# Patient Record
Sex: Female | Born: 1935
Health system: Southern US, Community
[De-identification: ages and names within clinical notes are randomized; demographics above are authoritative.]

## PROBLEM LIST (undated history)

## (undated) DIAGNOSIS — R3129 Other microscopic hematuria: Secondary | ICD-10-CM

## (undated) DIAGNOSIS — Z01419 Encounter for gynecological examination (general) (routine) without abnormal findings: Secondary | ICD-10-CM

## (undated) DIAGNOSIS — E042 Nontoxic multinodular goiter: Secondary | ICD-10-CM

## (undated) DIAGNOSIS — G4700 Insomnia, unspecified: Secondary | ICD-10-CM

## (undated) DIAGNOSIS — E785 Hyperlipidemia, unspecified: Secondary | ICD-10-CM

## (undated) DIAGNOSIS — M8580 Other specified disorders of bone density and structure, unspecified site: Secondary | ICD-10-CM

## (undated) DIAGNOSIS — T7840XA Allergy, unspecified, initial encounter: Secondary | ICD-10-CM

## (undated) HISTORY — DX: Other specified disorders of bone density and structure, unspecified site: M85.80

## (undated) HISTORY — DX: Nontoxic multinodular goiter: E04.2

## (undated) HISTORY — DX: Insomnia, unspecified: G47.00

## (undated) HISTORY — DX: Allergy, unspecified, initial encounter: T78.40XA

## (undated) HISTORY — PX: CATARACT EXTRACTION: SUR2

## (undated) HISTORY — DX: Hyperlipidemia, unspecified: E78.5

## (undated) HISTORY — DX: Encounter for gynecological examination (general) (routine) without abnormal findings: Z01.419

## (undated) HISTORY — PX: RETINAL DETACHMENT SURGERY: SHX105

## (undated) HISTORY — DX: Other microscopic hematuria: R31.29

---

## 2001-12-21 ENCOUNTER — Emergency Department (HOSPITAL_COMMUNITY): Admission: EM | Admit: 2001-12-21 | Discharge: 2001-12-21 | Payer: Self-pay | Admitting: Emergency Medicine

## 2001-12-21 ENCOUNTER — Encounter: Payer: Self-pay | Admitting: Emergency Medicine

## 2002-03-15 ENCOUNTER — Other Ambulatory Visit: Admission: RE | Admit: 2002-03-15 | Discharge: 2002-03-15 | Payer: Self-pay | Admitting: Obstetrics and Gynecology

## 2002-04-11 DIAGNOSIS — E042 Nontoxic multinodular goiter: Secondary | ICD-10-CM

## 2002-04-11 HISTORY — DX: Nontoxic multinodular goiter: E04.2

## 2002-09-23 ENCOUNTER — Encounter (INDEPENDENT_AMBULATORY_CARE_PROVIDER_SITE_OTHER): Payer: Self-pay | Admitting: *Deleted

## 2002-09-23 ENCOUNTER — Ambulatory Visit (HOSPITAL_COMMUNITY): Admission: RE | Admit: 2002-09-23 | Discharge: 2002-09-23 | Payer: Self-pay | Admitting: *Deleted

## 2003-04-03 ENCOUNTER — Other Ambulatory Visit: Admission: RE | Admit: 2003-04-03 | Discharge: 2003-04-03 | Payer: Self-pay | Admitting: Diagnostic Radiology

## 2003-08-28 ENCOUNTER — Other Ambulatory Visit: Admission: RE | Admit: 2003-08-28 | Discharge: 2003-08-28 | Payer: Self-pay | Admitting: Obstetrics and Gynecology

## 2004-02-20 ENCOUNTER — Ambulatory Visit (HOSPITAL_BASED_OUTPATIENT_CLINIC_OR_DEPARTMENT_OTHER): Admission: RE | Admit: 2004-02-20 | Discharge: 2004-02-20 | Payer: Self-pay | Admitting: General Surgery

## 2004-04-11 HISTORY — PX: HERNIA REPAIR: SHX51

## 2004-09-21 ENCOUNTER — Other Ambulatory Visit: Admission: RE | Admit: 2004-09-21 | Discharge: 2004-09-21 | Payer: Self-pay | Admitting: Obstetrics and Gynecology

## 2004-09-29 ENCOUNTER — Ambulatory Visit (HOSPITAL_COMMUNITY): Admission: RE | Admit: 2004-09-29 | Discharge: 2004-09-29 | Payer: Self-pay | Admitting: Obstetrics and Gynecology

## 2006-08-27 ENCOUNTER — Emergency Department (HOSPITAL_COMMUNITY): Admission: EM | Admit: 2006-08-27 | Discharge: 2006-08-27 | Payer: Self-pay | Admitting: Emergency Medicine

## 2007-03-14 ENCOUNTER — Ambulatory Visit: Payer: Self-pay | Admitting: Family Medicine

## 2007-03-14 DIAGNOSIS — J309 Allergic rhinitis, unspecified: Secondary | ICD-10-CM | POA: Insufficient documentation

## 2007-03-14 DIAGNOSIS — E782 Mixed hyperlipidemia: Secondary | ICD-10-CM

## 2007-03-15 LAB — CONVERTED CEMR LAB
Alkaline Phosphatase: 81 units/L (ref 39–117)
BUN: 10 mg/dL (ref 6–23)
Basophils Absolute: 0 10*3/uL (ref 0.0–0.1)
CO2: 28 meq/L (ref 19–32)
Cholesterol: 156 mg/dL (ref 0–200)
Eosinophils Absolute: 0.1 10*3/uL (ref 0.0–0.6)
GFR calc Af Amer: 80 mL/min
HDL: 61.7 mg/dL (ref >39.0)
Hemoglobin: 13.5 g/dL (ref 12.0–15.0)
Lymphocytes Relative: 12.3 % (ref 12.0–46.0)
MCHC: 34.7 g/dL (ref 30.0–36.0)
MCV: 94.9 fL (ref 78.0–100.0)
Monocytes Absolute: 0.9 10*3/uL — ABNORMAL HIGH (ref 0.2–0.7)
Monocytes Relative: 9.1 % (ref 3.0–11.0)
Neutro Abs: 7.8 10*3/uL — ABNORMAL HIGH (ref 1.4–7.7)
Potassium: 4.5 meq/L (ref 3.5–5.1)
Total Protein: 7.1 g/dL (ref 6.0–8.3)

## 2007-06-19 ENCOUNTER — Ambulatory Visit: Payer: Self-pay | Admitting: Family Medicine

## 2007-06-19 DIAGNOSIS — F411 Generalized anxiety disorder: Secondary | ICD-10-CM | POA: Insufficient documentation

## 2007-06-19 DIAGNOSIS — R002 Palpitations: Secondary | ICD-10-CM | POA: Insufficient documentation

## 2007-07-30 ENCOUNTER — Ambulatory Visit: Payer: Self-pay | Admitting: Family Medicine

## 2007-07-30 DIAGNOSIS — R209 Unspecified disturbances of skin sensation: Secondary | ICD-10-CM

## 2007-08-02 LAB — CONVERTED CEMR LAB
ALT: 40 units/L — ABNORMAL HIGH (ref 0–35)
AST: 31 units/L (ref 0–37)
Albumin: 3.9 g/dL (ref 3.5–5.2)
BUN: 17 mg/dL (ref 6–23)
Basophils Relative: 0.1 % (ref 0.0–1.0)
CO2: 32 meq/L (ref 19–32)
Chloride: 106 meq/L (ref 96–112)
Creatinine, Ser: 0.9 mg/dL (ref 0.4–1.2)
Eosinophils Absolute: 0.1 10*3/uL (ref 0.0–0.7)
Eosinophils Relative: 1 % (ref 0.0–5.0)
Glucose, Bld: 84 mg/dL (ref 70–99)
Lymphocytes Relative: 20.7 % (ref 12.0–46.0)
MCV: 95.5 fL (ref 78.0–100.0)
Neutrophils Relative %: 69.2 % (ref 43.0–77.0)
RBC: 3.97 M/uL (ref 3.87–5.11)
Total Protein: 7 g/dL (ref 6.0–8.3)
Vitamin B-12: 721 pg/mL (ref 211–911)
WBC: 9.5 10*3/uL (ref 4.5–10.5)

## 2007-09-23 ENCOUNTER — Emergency Department (HOSPITAL_BASED_OUTPATIENT_CLINIC_OR_DEPARTMENT_OTHER): Admission: EM | Admit: 2007-09-23 | Discharge: 2007-09-23 | Payer: Self-pay | Admitting: Emergency Medicine

## 2007-10-17 ENCOUNTER — Encounter: Payer: Self-pay | Admitting: Family Medicine

## 2008-01-28 ENCOUNTER — Ambulatory Visit: Payer: Self-pay | Admitting: Family Medicine

## 2008-04-21 ENCOUNTER — Ambulatory Visit: Payer: Self-pay | Admitting: Family Medicine

## 2008-04-25 ENCOUNTER — Ambulatory Visit: Payer: Self-pay | Admitting: Family Medicine

## 2008-05-02 ENCOUNTER — Ambulatory Visit: Payer: Self-pay | Admitting: Family Medicine

## 2008-05-05 ENCOUNTER — Ambulatory Visit: Payer: Self-pay | Admitting: Family Medicine

## 2008-05-06 ENCOUNTER — Telehealth: Payer: Self-pay | Admitting: Family Medicine

## 2008-05-12 ENCOUNTER — Telehealth: Payer: Self-pay | Admitting: Family Medicine

## 2008-07-10 HISTORY — PX: COLONOSCOPY: SHX174

## 2008-08-19 ENCOUNTER — Ambulatory Visit: Payer: Self-pay | Admitting: Family Medicine

## 2008-08-21 LAB — CONVERTED CEMR LAB
ALT: 23 units/L (ref 0–35)
Albumin: 4 g/dL (ref 3.5–5.2)
Alkaline Phosphatase: 58 units/L (ref 39–117)
Basophils Relative: 0 % (ref 0.0–3.0)
CO2: 30 meq/L (ref 19–32)
Chloride: 107 meq/L (ref 96–112)
Eosinophils Absolute: 0.1 10*3/uL (ref 0.0–0.7)
Eosinophils Relative: 1.2 % (ref 0.0–5.0)
Hemoglobin: 13.5 g/dL (ref 12.0–15.0)
Lymphocytes Relative: 15.8 % (ref 12.0–46.0)
MCHC: 35 g/dL (ref 30.0–36.0)
MCV: 96.3 fL (ref 78.0–100.0)
Monocytes Absolute: 0.9 10*3/uL (ref 0.1–1.0)
Neutro Abs: 8.3 10*3/uL — ABNORMAL HIGH (ref 1.4–7.7)
RBC: 4 M/uL (ref 3.87–5.11)
Sodium: 142 meq/L (ref 135–145)
Total CHOL/HDL Ratio: 3
Total Protein: 7.3 g/dL (ref 6.0–8.3)
WBC: 11 10*3/uL — ABNORMAL HIGH (ref 4.5–10.5)

## 2008-08-29 ENCOUNTER — Ambulatory Visit: Payer: Self-pay | Admitting: Family Medicine

## 2008-08-29 LAB — CONVERTED CEMR LAB
Glucose, Urine, Semiquant: NEGATIVE
Nitrite: NEGATIVE
Urobilinogen, UA: 0.2
WBC Urine, dipstick: NEGATIVE
pH: 5.5

## 2008-08-30 ENCOUNTER — Encounter: Payer: Self-pay | Admitting: Family Medicine

## 2009-01-27 ENCOUNTER — Ambulatory Visit: Payer: Self-pay | Admitting: Family Medicine

## 2009-01-27 DIAGNOSIS — E559 Vitamin D deficiency, unspecified: Secondary | ICD-10-CM | POA: Insufficient documentation

## 2009-06-22 ENCOUNTER — Ambulatory Visit: Payer: Self-pay | Admitting: Diagnostic Radiology

## 2009-06-22 ENCOUNTER — Emergency Department (HOSPITAL_BASED_OUTPATIENT_CLINIC_OR_DEPARTMENT_OTHER): Admission: EM | Admit: 2009-06-22 | Discharge: 2009-06-22 | Payer: Self-pay | Admitting: Emergency Medicine

## 2009-06-26 ENCOUNTER — Ambulatory Visit: Payer: Self-pay | Admitting: Family Medicine

## 2009-06-26 DIAGNOSIS — M546 Pain in thoracic spine: Secondary | ICD-10-CM | POA: Insufficient documentation

## 2009-06-26 DIAGNOSIS — M899 Disorder of bone, unspecified: Secondary | ICD-10-CM | POA: Insufficient documentation

## 2009-06-26 DIAGNOSIS — M949 Disorder of cartilage, unspecified: Secondary | ICD-10-CM

## 2009-08-24 ENCOUNTER — Ambulatory Visit: Payer: Self-pay | Admitting: Family Medicine

## 2009-08-24 DIAGNOSIS — I6529 Occlusion and stenosis of unspecified carotid artery: Secondary | ICD-10-CM | POA: Insufficient documentation

## 2009-08-24 LAB — CONVERTED CEMR LAB
Bilirubin Urine: NEGATIVE
Ketones, urine, test strip: NEGATIVE
Nitrite: NEGATIVE
Protein, U semiquant: NEGATIVE
Urobilinogen, UA: 0.2

## 2009-08-26 LAB — CONVERTED CEMR LAB: Vit D, 25-Hydroxy: 41 ng/mL (ref 30–89)

## 2009-08-27 LAB — CONVERTED CEMR LAB
ALT: 23 units/L (ref 0–35)
Alkaline Phosphatase: 66 units/L (ref 39–117)
BUN: 16 mg/dL (ref 6–23)
Basophils Relative: 0.3 % (ref 0.0–3.0)
Bilirubin, Direct: 0.1 mg/dL (ref 0.0–0.3)
Calcium: 9 mg/dL (ref 8.4–10.5)
Cholesterol: 179 mg/dL (ref 0–200)
Creatinine, Ser: 0.7 mg/dL (ref 0.4–1.2)
Eosinophils Absolute: 0.2 10*3/uL (ref 0.0–0.7)
Eosinophils Relative: 1.9 % (ref 0.0–5.0)
HDL: 65.4 mg/dL (ref >39.00)
LDL Cholesterol: 93 mg/dL (ref 0–99)
Lymphocytes Relative: 17.8 % (ref 12.0–46.0)
MCV: 95.4 fL (ref 78.0–100.0)
Monocytes Absolute: 0.8 10*3/uL (ref 0.1–1.0)
Neutrophils Relative %: 71.4 % (ref 43.0–77.0)
Platelets: 350 10*3/uL (ref 150.0–400.0)
RBC: 3.86 M/uL — ABNORMAL LOW (ref 3.87–5.11)
Total Bilirubin: 0.4 mg/dL (ref 0.3–1.2)
Total CHOL/HDL Ratio: 3
Total Protein: 6.6 g/dL (ref 6.0–8.3)
Triglycerides: 104 mg/dL (ref 0.0–149.0)
VLDL: 20.8 mg/dL (ref 0.0–40.0)
WBC: 9.2 10*3/uL (ref 4.5–10.5)

## 2009-09-22 ENCOUNTER — Encounter: Payer: Self-pay | Admitting: Family Medicine

## 2009-09-23 ENCOUNTER — Ambulatory Visit: Payer: Self-pay

## 2009-09-23 ENCOUNTER — Encounter: Payer: Self-pay | Admitting: Family Medicine

## 2009-10-01 ENCOUNTER — Telehealth: Payer: Self-pay | Admitting: Family Medicine

## 2009-10-02 ENCOUNTER — Telehealth: Payer: Self-pay | Admitting: Family Medicine

## 2009-12-21 ENCOUNTER — Telehealth: Payer: Self-pay | Admitting: Family Medicine

## 2010-01-13 ENCOUNTER — Telehealth: Payer: Self-pay | Admitting: Family Medicine

## 2010-03-02 ENCOUNTER — Ambulatory Visit: Payer: Self-pay | Admitting: Family Medicine

## 2010-03-02 DIAGNOSIS — H9190 Unspecified hearing loss, unspecified ear: Secondary | ICD-10-CM | POA: Insufficient documentation

## 2010-03-12 ENCOUNTER — Encounter: Payer: Self-pay | Admitting: Family Medicine

## 2010-03-18 ENCOUNTER — Ambulatory Visit (HOSPITAL_COMMUNITY)
Admission: RE | Admit: 2010-03-18 | Discharge: 2010-03-18 | Payer: Self-pay | Source: Home / Self Care | Attending: Obstetrics and Gynecology | Admitting: Obstetrics and Gynecology

## 2010-05-01 ENCOUNTER — Encounter: Payer: Self-pay | Admitting: Family Medicine

## 2010-05-02 ENCOUNTER — Encounter: Payer: Self-pay | Admitting: Family Medicine

## 2010-05-11 NOTE — Progress Notes (Signed)
Summary: judi please call  Phone Note Call from Patient Call back at Home Phone [phone]   Caller: Patient Call For: Nelwyn Salisbury MD Summary of Call: pt would like judi call her today Initial call taken by: Heron Sabins,  October 02, 2009 12:56 PM  Follow-up for Phone Call        Phone Call Completed Follow-up by: Raechel Ache, RN,  October 02, 2009 1:01 PM

## 2010-05-11 NOTE — Progress Notes (Signed)
Summary: please call cell  Phone Note Call from Patient Call back at [phone] cell   Caller: Patient Call For: Nelwyn Salisbury MD Summary of Call: judi please call pt on cell Initial call taken by: Heron Sabins,  October 01, 2009 2:22 PM  Follow-up for Phone Call        report given Follow-up by: Raechel Ache, RN,  October 02, 2009 8:22 AM

## 2010-05-11 NOTE — Progress Notes (Signed)
Summary: refill zolpidem  Phone Note From Pharmacy   [facility] Summary of Call: refill zolpidem  was given rx may 11 with 5 refills Initial call taken by: Pura Spice, RN,  December 21, 2009 12:38 PM  Follow-up for Phone Call        No, I gave her a 6 month supply in May Follow-up by: Nelwyn Salisbury MD,  December 22, 2009 8:26 AM

## 2010-05-11 NOTE — Consult Note (Signed)
Summary: [facility]  [facility]   Imported By: Maryln Gottron 03/17/2010 15:13:59  _____________________________________________________________________  External Attachment:    Type:   Image     Comment:   External Document

## 2010-05-11 NOTE — Miscellaneous (Signed)
Summary: Orders Update  Clinical Lists Changes  Orders: Added new Test order of Carotid Duplex (Carotid Duplex) - Signed 

## 2010-05-11 NOTE — Assessment & Plan Note (Signed)
Summary: EAR DISCOMFORT / CONGESTION // RS   Vital Signs:  Patient profile:   76 year old female Weight:      139 pounds O2 Sat:      98 % Temp:     97.8 degrees F Pulse rate:   86 / minute BP sitting:   110 / 78  (left arm) Cuff size:   regular  Vitals Entered By: Pura Spice, RN (March 02, 2010 2:31 PM) CC: loss hearing left ear wants referral to ENT    History of Present Illness: Here for loss of hearing in the left ear over the past few days. She has some underlying hearing loss and tinnitis, but this loss is beyond the typical. No URI symptoms, no pain, no dizziness. She is asking for a referral to ENT. She is taking Zyrtec and Mucinex D.  Allergies (verified): No Known Drug Allergies  Past History:  Past Medical History: Reviewed history from 08/19/2008 and no changes required. Allergic rhinitis Hyperlipidemia benign thyroid nodules per [place] and uptake scan 11-04 (saw Dr. Gerrit Friends), and negative biopsy 12-04 negative cardiac workup for chest pains in 1-08, saw Dr. Elsie Lincoln, had ECHO and treadmill stress test osteopenia per DEXA 08-2008 insomnia benign hematuria, worked up by  Dr. Vernie Ammons 10-2007 sees Dr. Arelia Sneddon for GYN exams  Past Surgical History: Reviewed history from 08/19/2008 and no changes required. Cataract extraction, bilateral (sees Dr. Jettie Pagan at [facility])  Umbilical Hernia repair 2006 bilateral retinal tears, S/P laser treatments colonoscopy 07-2008 per Dr. Charolett Bumpers, repeat in 5 yrs  Review of Systems  The patient denies anorexia, fever, weight loss, weight gain, vision loss, hoarseness, chest pain, syncope, dyspnea on exertion, peripheral edema, prolonged cough, headaches, hemoptysis, abdominal pain, melena, hematochezia, severe indigestion/heartburn, hematuria, incontinence, genital sores, muscle weakness, suspicious skin lesions, transient blindness, difficulty walking, depression, unusual weight change, abnormal bleeding, enlarged lymph nodes,  angioedema, breast masses, and testicular masses.    Physical Exam  General:  Well-developed,well-nourished,in no acute distress; alert,appropriate and cooperative throughout examination Head:  Normocephalic and atraumatic without obvious abnormalities. No apparent alopecia or balding. Eyes:  No corneal or conjunctival inflammation noted. EOMI. Perrla. Funduscopic exam benign, without hemorrhages, exudates or papilledema. Vision grossly normal. Ears:  External ear exam shows no significant lesions or deformities.  Otoscopic examination reveals clear canals, tympanic membranes are intact bilaterally without bulging, retraction, inflammation or discharge. Hearing is grossly normal bilaterally. Nose:  External nasal examination shows no deformity or inflammation. Nasal mucosa are pink and moist without lesions or exudates. Mouth:  Oral mucosa and oropharynx without lesions or exudates.  Teeth in good repair. Neck:  No deformities, masses, or tenderness noted. Lungs:  Normal respiratory effort, chest expands symmetrically. Lungs are clear to auscultation, no crackles or wheezes.   Impression & Recommendations:  Problem # 1:  HEARING LOSS (ICD-389.9)  Orders: ENT Referral (ENT)  Problem # 2:  EUSTACHIAN TUBE DYSFUNCTION (ICD-381.81)  Complete Medication List: 1)  Lipitor 20 Mg Tabs (Atorvastatin calcium) .Marland Kitchen.. 1 by mouth once daily 2)  Ambien 5 Mg Tabs (Zolpidem tartrate) .Marland Kitchen.. 1 or 2 at bedtime as needed 3)  Metoprolol Succinate 25 Mg Xr24h-tab (Metoprolol succinate) .... Once daily 4)  Aspir-low 81 Mg Tbec (Aspirin) .... Once daily 5)  Zyrtec Allergy 10 Mg Tbdp (Cetirizine hcl) .... Once daily as needed allergies 6)  Prednisone (pak) 10 Mg Tabs (Prednisone) .... As directed for 6 days  Patient Instructions: 1)  Try a prednisone dose pack. Refer to ENT Prescriptions:  PREDNISONE (PAK) 10 MG TABS (PREDNISONE) as directed for 6 days  #1 x 0   Entered and Authorized by:   Nelwyn Salisbury MD    Signed by:   Nelwyn Salisbury MD on 03/02/2010   Method used:   Electronically to        [facility] # [phone]* (retail)       [address]       [place], [place]  [address]       Ph: [phone]       Fax: [phone]   RxID:   [id number]    Orders Added: 1)  Est. Patient Level IV [24401] 2)  ENT Referral [ENT]

## 2010-05-11 NOTE — Assessment & Plan Note (Signed)
Summary: CPX (PT WILL COME IN FASTING) // RS   Vital Signs:  Patient profile:   76 year old female Weight:      126 pounds BMI:     23.89 BP sitting:   110 / 64  (left arm) Cuff size:   regular  Vitals Entered By: Raechel Ache, RN (Aug 24, 2009 9:11 AM) CC: OV, fasting. Seeing GYN today. , Hypertension Management   History of Present Illness: 76 yr old female for a cpx. She feels well physically but she is [place] lot of stress dealing with family issues. She has trouble sleeping, but Zolpidem helps. She would like to have some more Ativan to keep around. She asks about a carotid US, since she had a workplace screening version of this done about 5 years ago and was told she had a "slight blockage". She has no symptoms of this.   Hypertension History:      Positive major cardiovascular risk factors include female age 57 years old or older and hyperlipidemia.  Negative major cardiovascular risk factors include non-tobacco-user status.     Allergies (verified): No Known Drug Allergies  Past History:  Past Medical History: Reviewed history from 08/19/2008 and no changes required. Allergic rhinitis Hyperlipidemia benign thyroid nodules per [place] and uptake scan 11-04 (saw Dr. Gerrit Friends), and negative biopsy 12-04 negative cardiac workup for chest pains in 1-08, saw Dr. Elsie Lincoln, had ECHO and treadmill stress test osteopenia per DEXA 08-2008 insomnia benign hematuria, worked up by  Dr. Vernie Ammons 10-2007 sees Dr. Arelia Sneddon for GYN exams  Past Surgical History: Reviewed history from 08/19/2008 and no changes required. Cataract extraction, bilateral (sees Dr. Jettie Pagan at [facility])  Umbilical Hernia repair 2006 bilateral retinal tears, S/P laser treatments colonoscopy 07-2008 per Dr. Charolett Bumpers, repeat in 5 yrs  Family History: Reviewed history from 03/14/2007 and no changes required. Family History Diabetes 1st degree relative Family History of Prostate CA 1st degree relative <50 Family  History of Heart disease  Social History: Reviewed history from 03/14/2007 and no changes required. Widow Never Smoked Alcohol use-no Drug use-no Regular exercise-yes OccupationPublishing rights manager of nursing at [employer]  Review of Systems  The patient denies anorexia, fever, weight loss, weight gain, vision loss, decreased hearing, hoarseness, chest pain, syncope, dyspnea on exertion, peripheral edema, prolonged cough, headaches, hemoptysis, abdominal pain, melena, hematochezia, severe indigestion/heartburn, hematuria, incontinence, genital sores, muscle weakness, suspicious skin lesions, transient blindness, difficulty walking, depression, unusual weight change, abnormal bleeding, enlarged lymph nodes, angioedema, breast masses, and testicular masses.    Physical Exam  General:  Well-developed,well-nourished,in no acute distress; alert,appropriate and cooperative throughout examination Head:  Normocephalic and atraumatic without obvious abnormalities. No apparent alopecia or balding. Eyes:  No corneal or conjunctival inflammation noted. EOMI. Perrla. Funduscopic exam benign, without hemorrhages, exudates or papilledema. Vision grossly normal. Ears:  External ear exam shows no significant lesions or deformities.  Otoscopic examination reveals clear canals, tympanic membranes are intact bilaterally without bulging, retraction, inflammation or discharge. Hearing is grossly normal bilaterally. Nose:  External nasal examination shows no deformity or inflammation. Nasal mucosa are pink and moist without lesions or exudates. Mouth:  Oral mucosa and oropharynx without lesions or exudates.  Teeth in good repair. Neck:  No deformities, masses, or tenderness noted. No bruits. Chest Wall:  No deformities, masses, or tenderness noted. Lungs:  Normal respiratory effort, chest expands symmetrically. Lungs are clear to auscultation, no crackles or wheezes. Heart:  Normal rate and regular rhythm. S1 and S2  normal without  gallop, murmur, click, rub or other extra sounds. EKG normal Abdomen:  Bowel sounds positive,abdomen soft and non-tender without masses, organomegaly or hernias noted. Msk:  No deformity or scoliosis noted of thoracic or lumbar spine.   Pulses:  R and L carotid,radial,femoral,dorsalis pedis and posterior tibial pulses are full and equal bilaterally Extremities:  No clubbing, cyanosis, edema, or deformity noted with normal full range of motion of all joints.   Neurologic:  No cranial nerve deficits noted. Station and gait are normal. Plantar reflexes are down-going bilaterally. DTRs are symmetrical throughout. Sensory, motor and coordinative functions appear intact. Skin:  Intact without suspicious lesions or rashes Cervical Nodes:  No lymphadenopathy noted Axillary Nodes:  No palpable lymphadenopathy Inguinal Nodes:  No significant adenopathy Psych:  Cognition and judgment appear intact. Alert and cooperative with normal attention span and concentration. No apparent delusions, illusions, hallucinations   Impression & Recommendations:  Problem # 1:  WELL ADULT EXAM (ICD-V70.0)  Orders: UA Dipstick w/o Micro (automated)  (81003) EKG w/ Interpretation (93000) Venipuncture (54098) TLB-Lipid Panel (80061-LIPID) TLB-BMP (Basic Metabolic Panel-BMET) (80048-METABOL) TLB-CBC Platelet - w/Differential (85025-CBCD) TLB-Hepatic/Liver Function Pnl (80076-HEPATIC) TLB-TSH (Thyroid Stimulating Hormone) (84443-TSH) T-Vitamin D (25-Hydroxy) (11914-78295)  Complete Medication List: 1)  Lipitor 20 Mg Tabs (Atorvastatin calcium) .Marland Kitchen.. 1 by mouth once daily 2)  Ambien 5 Mg Tabs (Zolpidem tartrate) .Marland Kitchen.. 1 or 2 at bedtime as needed 3)  Metoprolol Succinate 25 Mg Xr24h-tab (Metoprolol succinate) .... Once daily 4)  Aspir-low 81 Mg Tbec (Aspirin) .... Once daily 5)  Zyrtec Allergy 10 Mg Tbdp (Cetirizine hcl) .... Once daily as needed allergies 6)  Ativan 0.5 Mg Tabs (Lorazepam) .Marland Kitchen.. 1 q 6  hours as needed anxiety  Other Orders: Doppler Referral (Doppler)  Hypertension Assessment/Plan:      The patient's hypertensive risk group is category B: At least one risk factor (excluding diabetes) with no target organ damage.  Her calculated 10 year risk of coronary heart disease is 3 %.  Today's blood pressure is 110/64.    Patient Instructions: 1)  get labs. Use Ativan as needed . Set up repeat carotid US. Start on daily low dose aspirin.  Prescriptions: ATIVAN 0.5 MG TABS (LORAZEPAM) 1 q 6 hours as needed anxiety  #60 x 5   Entered and Authorized by:   Nelwyn Salisbury MD   Signed by:   Nelwyn Salisbury MD on 08/24/2009   Method used:   Print then Give to Patient   RxID:   [id number] AMBIEN 5 MG  TABS (ZOLPIDEM TARTRATE) 1 or 2 at bedtime as needed  #60 x 5   Entered and Authorized by:   Nelwyn Salisbury MD   Signed by:   Nelwyn Salisbury MD on 08/24/2009   Method used:   Print then Give to Patient   RxID:   [id number] LIPITOR 20 MG TABS (ATORVASTATIN CALCIUM) 1 by mouth once daily  #90 x 3   Entered and Authorized by:   Nelwyn Salisbury MD   Signed by:   Nelwyn Salisbury MD on 08/24/2009   Method used:   Print then Give to Patient   RxID:   [id number]   Laboratory Results   Urine Tests    Routine Urinalysis   Color: yellow Appearance: Clear Glucose: negative   (Normal Range: Negative) Bilirubin: negative   (Normal Range: Negative) Ketone: negative   (Normal Range: Negative) Spec. Gravity: 1.015   (Normal Range: 1.003-1.035) Blood: 1+   (Normal Range: Negative) pH: 7.0   (  Normal Range: 5.0-8.0) Protein: negative   (Normal Range: Negative) Urobilinogen: 0.2   (Normal Range: 0-1) Nitrite: negative   (Normal Range: Negative) Leukocyte Esterace: negative   (Normal Range: Negative)    Comments: Rita Ohara  Aug 24, 2009 11:16 AM

## 2010-05-11 NOTE — Progress Notes (Signed)
Summary: Beverley Fiedler   Phone Note From Pharmacy   [facility]  Reason for Call: Needs renewal Summary of Call: refill ambien 5mg   last ov was cpx on 08/24/09 last refill 5/16/1 for 60 tabs w 5 rfs....Marland KitchenMarland KitchenKathrynn Speed.....Marland KitchenCMA.................. Initial call taken by: Pura Spice, RN,  January 13, 2010 5:25 PM  Follow-up for Phone Call        call in #60 with 5 rf Follow-up by: Nelwyn Salisbury MD,  January 15, 2010 8:34 AM  Additional Follow-up for Phone Call Additional follow up Details #1::        done Additional Follow-up by: Pura Spice, RN,  January 15, 2010 8:51 AM    Prescriptions: AMBIEN 5 MG  TABS (ZOLPIDEM TARTRATE) 1 or 2 at bedtime as needed  #30 x 5   Entered by:   Pura Spice, RN   Authorized by:   Nelwyn Salisbury MD   Signed by:   Pura Spice, RN on 01/15/2010   Method used:   Telephoned to ...       [facility] # [id number]* (retail)       [address]       [place], [place]  [address]       Ph: [phone]       Fax: [phone]   RxID:   [id number]
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
## 2010-06-18 ENCOUNTER — Other Ambulatory Visit: Payer: Self-pay | Admitting: *Deleted

## 2010-06-18 MED ORDER — ZOLPIDEM TARTRATE 5 MG PO TABS: 5.0000 mg | ORAL_TABLET | Freq: Every evening | ORAL | Status: DC | PRN

## 2010-06-18 NOTE — Telephone Encounter (Signed)
Refill request

## 2010-06-22 LAB — CBC
HCT: 39.4 % (ref 36.0–46.0)
Hemoglobin: 13.1 g/dL (ref 12.0–15.0)
MCV: 98.7 fL (ref 78.0–100.0)
WBC: 16.5 10*3/uL — ABNORMAL HIGH (ref 4.0–10.5)

## 2010-07-12 ENCOUNTER — Ambulatory Visit (INDEPENDENT_AMBULATORY_CARE_PROVIDER_SITE_OTHER): Payer: BC Managed Care – PPO | Admitting: Family Medicine

## 2010-07-12 ENCOUNTER — Encounter: Payer: Self-pay | Admitting: Family Medicine

## 2010-07-12 DIAGNOSIS — H9190 Unspecified hearing loss, unspecified ear: Secondary | ICD-10-CM

## 2010-07-12 DIAGNOSIS — H9319 Tinnitus, unspecified ear: Secondary | ICD-10-CM

## 2010-07-12 MED ORDER — PREDNISONE 10 MG PO TABS: ORAL_TABLET | ORAL | Status: DC

## 2010-07-12 MED ORDER — PAROXETINE HCL 20 MG PO TABS: 20.0000 mg | ORAL_TABLET | ORAL | Status: DC

## 2010-07-12 NOTE — Progress Notes (Signed)
  Subjective:    Patient ID: Alexandra Baldwin, female    DOB: 1935-03-04, 76 y.o.   MRN: [id number]  HPI Here to ask about hearing losses. She saw me last December for the fairly sudden loss of hearing in the left ear. No pain or trauma was related. I put her on a steroid taper and referred her to Dr. Jearld Fenton at ENT. He did no further testing but did recommend another steroid taper. The hearing did not improve, and over the past few months she has devel;oped some ringing in this ear as well. Now over the past few weeks she has begun to lose soome hearing in the right ear as well. She has larerady made an appt to see another ENT in [place] on 07-22-10, but she asks my opinion. No other neurologic symptoms. No HA or dizziness.   Review of Systems  Constitutional: Negative.   HENT: Positive for hearing loss and tinnitus.   Eyes: Negative.   Respiratory: Negative.   Cardiovascular: Negative.   Neurological: Negative.        Objective:   Physical Exam  Constitutional: She is oriented to person, place, and time. She appears well-developed and well-nourished.  HENT:  Head: Normocephalic and atraumatic.  Right Ear: External ear normal.  Left Ear: External ear normal.  Nose: Nose normal.  Mouth/Throat: Oropharynx is clear and moist. No oropharyngeal exudate.  Eyes: Conjunctivae and EOM are normal. Pupils are equal, round, and reactive to light.  Neck: Normal range of motion. Neck supple. No thyromegaly present.  Lymphadenopathy:    She has no cervical adenopathy.  Neurological: She is alert and oriented to person, place, and time. She displays normal reflexes. No cranial nerve deficit. She exhibits normal muscle tone. Coordination normal.          Assessment & Plan:  Bilateral hearing loss. The suddenness of this loss make me wonder about a possible viral or autoimmune process. I started her on another steroid taper to begin with 80 mg of prednisone daily. She will keep the ENT appt  above for 07-22-10. I will also get a brain MRI to check for acoustic neuromas or other structural problems.

## 2010-07-16 ENCOUNTER — Ambulatory Visit
Admission: RE | Admit: 2010-07-16 | Discharge: 2010-07-16 | Disposition: A | Discharge status: Home / Self Care | Payer: BC Managed Care – PPO | Source: Ambulatory Visit | Attending: Family Medicine | Admitting: Family Medicine

## 2010-07-16 MED ORDER — GADOBENATE DIMEGLUMINE 529 MG/ML IV SOLN
10.0000 mL | Freq: Once | INTRAVENOUS | Status: AC | PRN
  Administered 2010-07-16: 10 mL via INTRAVENOUS

## 2010-07-21 ENCOUNTER — Telehealth: Payer: Self-pay | Admitting: *Deleted

## 2010-07-21 NOTE — Telephone Encounter (Signed)
Pt. Would like MRI results, please.

## 2010-07-22 ENCOUNTER — Telehealth: Payer: Self-pay

## 2010-07-22 NOTE — Telephone Encounter (Signed)
Pt aware of MRI results 

## 2010-07-22 NOTE — Telephone Encounter (Signed)
Message copied by Madison Hickman on Thu Jul 22, 2010  9:03 AM ------      Message from: Dwaine Deter      Created: Mon Jul 19, 2010 12:43 PM       Normal

## 2010-08-06 ENCOUNTER — Other Ambulatory Visit: Payer: Self-pay | Admitting: Family Medicine

## 2010-08-27 ENCOUNTER — Encounter: Payer: Self-pay | Admitting: Family Medicine

## 2010-08-27 ENCOUNTER — Ambulatory Visit (INDEPENDENT_AMBULATORY_CARE_PROVIDER_SITE_OTHER): Payer: BC Managed Care – PPO | Admitting: Family Medicine

## 2010-08-27 VITALS — BP 112/80 | HR 87 | Temp 98.4°F | Ht 61.0 in | Wt 138.0 lb

## 2010-08-27 DIAGNOSIS — Z0000 Encounter for general adult medical examination without abnormal findings: Secondary | ICD-10-CM

## 2010-08-27 DIAGNOSIS — M8580 Other specified disorders of bone density and structure, unspecified site: Secondary | ICD-10-CM

## 2010-08-27 DIAGNOSIS — G629 Polyneuropathy, unspecified: Secondary | ICD-10-CM

## 2010-08-27 DIAGNOSIS — G589 Mononeuropathy, unspecified: Secondary | ICD-10-CM

## 2010-08-27 DIAGNOSIS — M949 Disorder of cartilage, unspecified: Secondary | ICD-10-CM

## 2010-08-27 LAB — VITAMIN B12: Vitamin B-12: 441 pg/mL (ref 211–911)

## 2010-08-27 LAB — CBC WITH DIFFERENTIAL/PLATELET
Basophils Relative: 0.4 % (ref 0.0–3.0)
Eosinophils Relative: 1.8 % (ref 0.0–5.0)
HCT: 38 % (ref 36.0–46.0)
Lymphs Abs: 1.4 10*3/uL (ref 0.7–4.0)
Monocytes Relative: 10.1 % (ref 3.0–12.0)
Platelets: 361 10*3/uL (ref 150.0–400.0)
RBC: 3.95 Mil/uL (ref 3.87–5.11)
WBC: 8.5 10*3/uL (ref 4.5–10.5)

## 2010-08-27 LAB — POCT URINALYSIS DIPSTICK
Bilirubin, UA: NEGATIVE
Glucose, UA: NEGATIVE
Ketones, UA: NEGATIVE
Spec Grav, UA: 1.015
Urobilinogen, UA: 0.2

## 2010-08-27 LAB — BASIC METABOLIC PANEL
Calcium: 9.2 mg/dL (ref 8.4–10.5)
Creatinine, Ser: 0.8 mg/dL (ref 0.4–1.2)
GFR: 75.68 mL/min (ref >60.00)
Sodium: 141 mEq/L (ref 135–145)

## 2010-08-27 LAB — LIPID PANEL
HDL: 57.4 mg/dL (ref >39.00)
LDL Cholesterol: 75 mg/dL (ref 0–99)
Total CHOL/HDL Ratio: 3
Triglycerides: 98 mg/dL (ref 0.0–149.0)

## 2010-08-27 LAB — TSH: TSH: 1.04 u[IU]/mL (ref 0.35–5.50)

## 2010-08-27 LAB — HEPATIC FUNCTION PANEL
Alkaline Phosphatase: 49 U/L (ref 39–117)
Bilirubin, Direct: 0.1 mg/dL (ref 0.0–0.3)
Total Bilirubin: 0.5 mg/dL (ref 0.3–1.2)
Total Protein: 6.1 g/dL (ref 6.0–8.3)

## 2010-08-27 MED ORDER — METOPROLOL SUCCINATE ER 25 MG PO TB24: 25.0000 mg | ORAL_TABLET | Freq: Every day | ORAL | Status: DC

## 2010-08-27 MED ORDER — ATORVASTATIN CALCIUM 20 MG PO TABS: 20.0000 mg | ORAL_TABLET | Freq: Every day | ORAL | Status: DC

## 2010-08-27 NOTE — Progress Notes (Signed)
  Subjective:    Patient ID: Alexandra Baldwin, female    DOB: [birth year], 76 y.o.   MRN: [id number]  HPI 76 yr old female for a cpx. She feels well and has only one concern. She has intermittent numbness and tingling in her hands, especially when doing a lot of computer work or driving her car. There is no pain or weakness.    Review of Systems  Constitutional: Negative.   HENT: Negative.   Eyes: Negative.   Respiratory: Negative.   Cardiovascular: Negative.   Gastrointestinal: Negative.   Genitourinary: Negative for dysuria, urgency, frequency, hematuria, flank pain, decreased urine volume, enuresis, difficulty urinating, pelvic pain and dyspareunia.  Musculoskeletal: Negative.   Skin: Negative.   Neurological: Negative.   Hematological: Negative.   Psychiatric/Behavioral: Negative.        Objective:   Physical Exam  Constitutional: She is oriented to person, place, and time. She appears well-developed and well-nourished. No distress.  HENT:  Head: Normocephalic and atraumatic.  Right Ear: External ear normal.  Left Ear: External ear normal.  Nose: Nose normal.  Mouth/Throat: Oropharynx is clear and moist. No oropharyngeal exudate.  Eyes: Conjunctivae and EOM are normal. Pupils are equal, round, and reactive to light. No scleral icterus.  Neck: Normal range of motion. Neck supple. No JVD present. No thyromegaly present.  Cardiovascular: Normal rate, regular rhythm, normal heart sounds and intact distal pulses.  Exam reveals no gallop and no friction rub.   No murmur heard.      EKG normal   Pulmonary/Chest: Effort normal and breath sounds normal. No respiratory distress. She has no wheezes. She has no rales. She exhibits no tenderness.  Abdominal: Soft. Bowel sounds are normal. She exhibits no distension and no mass. There is no tenderness. There is no rebound and no guarding.  Musculoskeletal: Normal range of motion. She exhibits no edema and no tenderness.  Lymphadenopathy:      She has no cervical adenopathy.  Neurological: She is alert and oriented to person, place, and time. She has normal reflexes. No cranial nerve deficit. She exhibits normal muscle tone. Coordination normal.  Skin: Skin is warm and dry. No rash noted. No erythema.  Psychiatric: She has a normal mood and affect. Her behavior is normal. Judgment and thought content normal.          Assessment & Plan:  Get fasting labs today. She probably has some carpal tunnel syndrome, so I suggested she wear wrist braces for a month and return if this does not help.

## 2010-08-27 NOTE — Op Note (Signed)
Alexandra, Baldwin             ACCOUNT NO.:  [id number]   MEDICAL RECORD NO.:  [id number]          PATIENT TYPE:  AMB   LOCATION:  [facility]                         FACILITY:  [facility]   PHYSICIAN:  Angelia Mould. Derrell Lolling, M.D.DATE OF BIRTH:  [birth year]   DATE OF PROCEDURE:  02/20/2004  DATE OF DISCHARGE:                                 OPERATIVE REPORT   PREOPERATIVE DIAGNOSES:  Incarcerated umbilical hernia.   POSTOPERATIVE DIAGNOSES:  Incarcerated umbilical hernia.   OPERATION PERFORMED:  Repair incarcerated umbilical hernia.   SURGEON:  Dr. Claud Kelp   OPERATIVE INDICATIONS:  This is a 76 year old white female, who has had an  umbilical hernia for 2-3 years.  It has gotten larger and is becoming  painful.  On exam, she has a small to moderate sized umbilical hernia.  The  hernia is incarcerated, but there are no inflammatory changes.  She is  brought to the operating room electively for repair.   OPERATIVE TECHNIQUE:  The patient was placed supine on the operating table.  She was monitored and sedated by the anesthesia department.  The abdomen was  prepped and draped in a sterile fashion.  Xylocaine 1% with epinephrine was  used as a local infiltration anesthetic.  A curved transverse incision was  made at the lower rim of the umbilicus.  Dissection was carried down to the  subcutaneous tissue.  I dissected around the umbilical skin and dissected  the umbilical skin off of the fascia.  I found that she had a fascial defect  about 1.5 cm or slightly larger with a large amount of incarcerated  preperitoneal fat.  I debrided the preperitoneal fat, and it was discarded.  The rest of the preperitoneal fat was simply reduced.  The muscle and fascia  surrounding this seemed quite normal and thick, and I chose to repair this  primarily.  Four interrupted sutures of #1 Novofil were used to repair the  hernia.  At the corners, these were simple sutures, and the two central  sutures were  placed in a vest-over-pants fashion.  After all four sutures  were placed, they were then tied.  This provided a nice, overlapping repair.  The wound was copiously irrigated with saline.  The umbilical skin was  tacked back down to the fascia with a 3-0 Vicryl suture.  The subcutaneous  tissue was closed with interrupted sutures of 3-0 Vicryl.  The skin was  closed with a running  subcuticular suture of 4-0 Vicryl and Steri-Strips.  Clean bandages were  placed and the patient taken to the recovery room in stable condition.  Estimated blood loss was about 5 mL.  Complications none.  Sponge, needle,  and instrument counts were correct.     Hayw   HMI/MEDQ  D:  02/20/2004  T:  02/20/2004  Job:  [id number]   cc:   Juluis Mire, M.D.  [address] [place]  [place] [address]  Fax: [phone]   Doris Cheadle. Foy Guadalajara, M.D.  [address] [place]  [place] [address]  Fax: [phone]

## 2010-08-28 LAB — VITAMIN D 25 HYDROXY (VIT D DEFICIENCY, FRACTURES): Vit D, 25-Hydroxy: 35 ng/mL (ref 30–89)

## 2010-08-30 NOTE — Progress Notes (Signed)
Quick Note:  Pt is aware. ______ 

## 2010-09-28 ENCOUNTER — Telehealth: Payer: Self-pay | Admitting: Family Medicine

## 2010-09-28 ENCOUNTER — Ambulatory Visit: Payer: BC Managed Care – PPO | Admitting: Family Medicine

## 2010-09-28 NOTE — Telephone Encounter (Signed)
Pt felled on Sunday night in the bath tub and feels she may have broken her ribs.Marland Kitchen9:17 AM she is in pain and feels she may need an appointment..request to be called at [phone]

## 2010-09-29 NOTE — Telephone Encounter (Signed)
She needs to be seen for this ASAP. Please call her and ask how she is today.

## 2010-09-29 NOTE — Telephone Encounter (Signed)
Pt went to Urgent Care and the ribs were bruised, no fracture.

## 2010-09-30 NOTE — Telephone Encounter (Signed)
noted 

## 2011-01-06 LAB — URINALYSIS, ROUTINE W REFLEX MICROSCOPIC
Bilirubin Urine: NEGATIVE
Glucose, UA: NEGATIVE
Ketones, ur: 15 — AB
pH: 6.5

## 2011-01-06 LAB — BASIC METABOLIC PANEL
Calcium: 9.3
GFR calc Af Amer: >60
GFR calc non Af Amer: >60
Glucose, Bld: 97
Sodium: 141

## 2011-01-06 LAB — URINE MICROSCOPIC-ADD ON

## 2011-01-06 LAB — CBC
HCT: 37.8
Hemoglobin: 13.1
RDW: 12.7
WBC: 6

## 2011-01-06 LAB — DIFFERENTIAL
Eosinophils Relative: 1
Lymphocytes Relative: 13
Lymphs Abs: 0.8
Monocytes Absolute: 0.6

## 2011-01-06 LAB — URINE CULTURE

## 2011-02-07 ENCOUNTER — Ambulatory Visit (INDEPENDENT_AMBULATORY_CARE_PROVIDER_SITE_OTHER): Payer: BC Managed Care – PPO

## 2011-02-07 DIAGNOSIS — Z23 Encounter for immunization: Secondary | ICD-10-CM

## 2011-02-24 ENCOUNTER — Ambulatory Visit (INDEPENDENT_AMBULATORY_CARE_PROVIDER_SITE_OTHER): Payer: BC Managed Care – PPO | Admitting: Family Medicine

## 2011-02-24 ENCOUNTER — Encounter: Payer: Self-pay | Admitting: Family Medicine

## 2011-02-24 VITALS — BP 124/62 | HR 74 | Temp 98.6°F | Wt 142.0 lb

## 2011-02-24 DIAGNOSIS — J329 Chronic sinusitis, unspecified: Secondary | ICD-10-CM

## 2011-02-24 MED ORDER — AMOXICILLIN-POT CLAVULANATE 875-125 MG PO TABS: 1.0000 | ORAL_TABLET | Freq: Two times a day (BID) | ORAL | Status: AC

## 2011-02-24 NOTE — Progress Notes (Signed)
  Subjective:    Patient ID: Alexandra Baldwin, female    DOB: [birth year], 76 y.o.   MRN: [id number]  HPI Here for one week of sinus pressure, PND, ST, and coughing up yellow sputum. No fever.    Review of Systems  Constitutional: Negative.   HENT: Positive for congestion, postnasal drip and sinus pressure.   Eyes: Negative.   Respiratory: Positive for cough.        Objective:   Physical Exam  Constitutional: She appears well-developed and well-nourished.  HENT:  Right Ear: External ear normal.  Left Ear: External ear normal.  Nose: Nose normal.  Mouth/Throat: Oropharynx is clear and moist. No oropharyngeal exudate.  Eyes: Conjunctivae are normal. Pupils are equal, round, and reactive to light.  Neck: No thyromegaly present.  Pulmonary/Chest: Effort normal and breath sounds normal.  Lymphadenopathy:    She has no cervical adenopathy.          Assessment & Plan:  Add Mucinex D.

## 2011-03-01 ENCOUNTER — Other Ambulatory Visit: Payer: Self-pay | Admitting: Family Medicine

## 2011-03-01 NOTE — Telephone Encounter (Signed)
Refill request for Zolpidem 5 mg take 1-2 qhs prn and pt last here on 02/24/11.

## 2011-03-02 MED ORDER — ZOLPIDEM TARTRATE 5 MG PO TABS: 5.0000 mg | ORAL_TABLET | Freq: Every evening | ORAL | Status: DC | PRN

## 2011-03-02 NOTE — Telephone Encounter (Signed)
Call in #[phone] with 5 rf 

## 2011-03-02 NOTE — Telephone Encounter (Signed)
[facility]

## 2011-04-27 ENCOUNTER — Encounter: Payer: Self-pay | Admitting: Family Medicine

## 2011-04-27 ENCOUNTER — Ambulatory Visit (INDEPENDENT_AMBULATORY_CARE_PROVIDER_SITE_OTHER): Payer: BC Managed Care – PPO | Admitting: Family Medicine

## 2011-04-27 VITALS — BP 118/70 | HR 70 | Temp 98.9°F | Wt 140.0 lb

## 2011-04-27 DIAGNOSIS — G44209 Tension-type headache, unspecified, not intractable: Secondary | ICD-10-CM

## 2011-04-27 DIAGNOSIS — M542 Cervicalgia: Secondary | ICD-10-CM

## 2011-04-27 MED ORDER — CYCLOBENZAPRINE HCL 10 MG PO TABS: 10.0000 mg | ORAL_TABLET | Freq: Three times a day (TID) | ORAL | Status: AC | PRN

## 2011-04-27 NOTE — Progress Notes (Signed)
  Subjective:    Patient ID: CAELA HUOT, female    DOB: 1934/12/14, 77 y.o.   MRN: [id number]  HPI Here for several months of intermittent tightness and pain in the upper back and neck, tightness across the shoulders, and posterior HAs. She has been under a lot of work stress and working a lot of overtime hours. Aleve helps a little. No recent trauma.    Review of Systems  Constitutional: Negative.   Musculoskeletal: Positive for back pain and arthralgias.  Neurological: Positive for headaches.       Objective:   Physical Exam  Constitutional: She is oriented to person, place, and time. She appears well-developed and well-nourished.  Eyes: Pupils are equal, round, and reactive to light.  Neck: Normal range of motion. Neck supple.  Musculoskeletal:       Tenderness with some spasm in the trapezeus muscles and the neck   Neurological: She is alert and oriented to person, place, and time. No cranial nerve deficit.          Assessment & Plan:  Tension HAs due to stress. Rest, heat, try Flexeril. Get a massage once in awhile

## 2011-07-27 ENCOUNTER — Encounter: Payer: Self-pay | Admitting: Family Medicine

## 2011-07-27 ENCOUNTER — Ambulatory Visit: Payer: BC Managed Care – PPO | Admitting: Family Medicine

## 2011-07-27 ENCOUNTER — Ambulatory Visit (INDEPENDENT_AMBULATORY_CARE_PROVIDER_SITE_OTHER): Payer: BC Managed Care – PPO | Admitting: Family Medicine

## 2011-07-27 VITALS — BP 108/56 | HR 90 | Temp 99.0°F | Wt 141.0 lb

## 2011-07-27 DIAGNOSIS — S8390XA Sprain of unspecified site of unspecified knee, initial encounter: Secondary | ICD-10-CM

## 2011-07-27 DIAGNOSIS — I10 Essential (primary) hypertension: Secondary | ICD-10-CM

## 2011-07-27 DIAGNOSIS — IMO0002 Reserved for concepts with insufficient information to code with codable children: Secondary | ICD-10-CM

## 2011-07-27 NOTE — Progress Notes (Signed)
  Subjective:    Patient ID: Alexandra Baldwin, female    DOB: Nov 11, 1934, 77 y.o.   MRN: [id number]  HPI Here for 2 issues. First she twisted her knee 3 weeks ago at work while carrying some heavy boxes. The knee swelled up after that but has slowly gone down a bit. She has some pain in the medial knee. No locking or giving way. Using Motrin with some relief. Also she thinks her BP is too low. She gets systolic readings of 70s and 80s often at home.    Review of Systems  Constitutional: Negative.   Respiratory: Negative.   Cardiovascular: Negative.   Musculoskeletal: Positive for arthralgias.       Objective:   Physical Exam  Constitutional: She appears well-developed and well-nourished.  Cardiovascular: Normal rate, regular rhythm, normal heart sounds and intact distal pulses.   Pulmonary/Chest: Effort normal and breath sounds normal.  Musculoskeletal:       The right knee is slightly swollen. Full ROM, no crepitus. Tender over the medial joint space.          Assessment & Plan:  Knee sprain. Continue Advil and ice. Wear a Neoprene support sleeve. Off work today until 08-01-11. Also stop the Metoprolol. Monitor the BP.

## 2011-08-03 ENCOUNTER — Other Ambulatory Visit: Payer: Self-pay | Admitting: Family Medicine

## 2011-08-05 ENCOUNTER — Ambulatory Visit (INDEPENDENT_AMBULATORY_CARE_PROVIDER_SITE_OTHER): Payer: BC Managed Care – PPO | Admitting: Ophthalmology

## 2011-08-08 ENCOUNTER — Ambulatory Visit (INDEPENDENT_AMBULATORY_CARE_PROVIDER_SITE_OTHER): Payer: BC Managed Care – PPO | Admitting: Ophthalmology

## 2011-08-08 DIAGNOSIS — H33009 Unspecified retinal detachment with retinal break, unspecified eye: Secondary | ICD-10-CM

## 2011-08-08 DIAGNOSIS — H3530 Unspecified macular degeneration: Secondary | ICD-10-CM

## 2011-08-08 DIAGNOSIS — H43819 Vitreous degeneration, unspecified eye: Secondary | ICD-10-CM

## 2011-08-08 DIAGNOSIS — H33309 Unspecified retinal break, unspecified eye: Secondary | ICD-10-CM

## 2011-08-08 DIAGNOSIS — H35379 Puckering of macula, unspecified eye: Secondary | ICD-10-CM

## 2011-08-23 ENCOUNTER — Other Ambulatory Visit: Payer: BC Managed Care – PPO

## 2011-08-26 ENCOUNTER — Encounter: Payer: Self-pay | Admitting: Family Medicine

## 2011-08-26 ENCOUNTER — Ambulatory Visit (INDEPENDENT_AMBULATORY_CARE_PROVIDER_SITE_OTHER): Payer: Medicare Other | Admitting: Family Medicine

## 2011-08-26 VITALS — BP 120/60 | HR 96 | Temp 98.6°F | Wt 142.0 lb

## 2011-08-26 DIAGNOSIS — M542 Cervicalgia: Secondary | ICD-10-CM

## 2011-08-26 DIAGNOSIS — L309 Dermatitis, unspecified: Secondary | ICD-10-CM

## 2011-08-26 DIAGNOSIS — F329 Major depressive disorder, single episode, unspecified: Secondary | ICD-10-CM

## 2011-08-26 DIAGNOSIS — L259 Unspecified contact dermatitis, unspecified cause: Secondary | ICD-10-CM

## 2011-08-26 MED ORDER — HALOBETASOL PROPIONATE 0.05 % EX CREA: TOPICAL_CREAM | Freq: Two times a day (BID) | CUTANEOUS | Status: DC

## 2011-08-26 MED ORDER — PAROXETINE HCL 10 MG PO TABS: 10.0000 mg | ORAL_TABLET | ORAL | Status: DC

## 2011-08-26 NOTE — Progress Notes (Signed)
  Subjective:    Patient ID: Alexandra Baldwin, female    DOB: May 22, 1934, 77 y.o.   MRN: [id number]  HPI Here for an itchy rash on the back of the neck and the left upper chest. Using OTC cortisone cream. She wants to taper off Paxil since she is doing so well. 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    Review of Systems  Constitutional: Negative.   Skin: Positive for rash.  Psychiatric/Behavioral: Negative.        Objective:   Physical Exam  Constitutional: She appears well-developed and well-nourished.  Skin:       There are patches of red macular scaly skin as above  Psychiatric: She has a normal mood and affect. Her behavior is normal. Thought content normal.          Assessment & Plan:  Treat the eczema with Halobetasol cream. Decrease the Paxil to 10 mg a day.

## 2011-08-30 ENCOUNTER — Encounter: Payer: BC Managed Care – PPO | Admitting: Family Medicine

## 2011-08-31 ENCOUNTER — Other Ambulatory Visit (INDEPENDENT_AMBULATORY_CARE_PROVIDER_SITE_OTHER): Payer: Medicare Other

## 2011-08-31 DIAGNOSIS — I10 Essential (primary) hypertension: Secondary | ICD-10-CM

## 2011-08-31 DIAGNOSIS — Z0000 Encounter for general adult medical examination without abnormal findings: Secondary | ICD-10-CM

## 2011-08-31 DIAGNOSIS — D649 Anemia, unspecified: Secondary | ICD-10-CM

## 2011-08-31 DIAGNOSIS — E039 Hypothyroidism, unspecified: Secondary | ICD-10-CM

## 2011-08-31 DIAGNOSIS — E785 Hyperlipidemia, unspecified: Secondary | ICD-10-CM

## 2011-08-31 LAB — BASIC METABOLIC PANEL
BUN: 16 mg/dL (ref 6–23)
CO2: 27 mEq/L (ref 19–32)
Chloride: 105 mEq/L (ref 96–112)
Creatinine, Ser: 0.8 mg/dL (ref 0.4–1.2)
Glucose, Bld: 84 mg/dL (ref 70–99)
Potassium: 4.2 mEq/L (ref 3.5–5.1)

## 2011-08-31 LAB — POCT URINALYSIS DIPSTICK
Bilirubin, UA: NEGATIVE
Leukocytes, UA: NEGATIVE
Nitrite, UA: NEGATIVE
Protein, UA: NEGATIVE
pH, UA: 6

## 2011-08-31 LAB — CBC WITH DIFFERENTIAL/PLATELET
Eosinophils Absolute: 0.2 10*3/uL (ref 0.0–0.7)
Eosinophils Relative: 2.1 % (ref 0.0–5.0)
HCT: 36.5 % (ref 36.0–46.0)
Lymphs Abs: 1.4 10*3/uL (ref 0.7–4.0)
MCHC: 33.2 g/dL (ref 30.0–36.0)
MCV: 97.7 fl (ref 78.0–100.0)
Monocytes Absolute: 0.6 10*3/uL (ref 0.1–1.0)
Platelets: 296 10*3/uL (ref 150.0–400.0)
RDW: 13.2 % (ref 11.5–14.6)
WBC: 8.6 10*3/uL (ref 4.5–10.5)

## 2011-08-31 LAB — HEPATIC FUNCTION PANEL
ALT: 25 U/L (ref 0–35)
Total Bilirubin: 0.5 mg/dL (ref 0.3–1.2)

## 2011-08-31 LAB — TSH: TSH: 1.12 u[IU]/mL (ref 0.35–5.50)

## 2011-09-02 ENCOUNTER — Other Ambulatory Visit: Payer: Self-pay | Admitting: Family Medicine

## 2011-09-07 ENCOUNTER — Ambulatory Visit (INDEPENDENT_AMBULATORY_CARE_PROVIDER_SITE_OTHER): Payer: Medicare Other | Admitting: Family Medicine

## 2011-09-07 ENCOUNTER — Encounter: Payer: Self-pay | Admitting: Family Medicine

## 2011-09-07 VITALS — BP 128/76 | HR 77 | Temp 99.0°F | Ht 60.75 in | Wt 140.0 lb

## 2011-09-07 DIAGNOSIS — Z0000 Encounter for general adult medical examination without abnormal findings: Secondary | ICD-10-CM

## 2011-09-07 DIAGNOSIS — E785 Hyperlipidemia, unspecified: Secondary | ICD-10-CM

## 2011-09-07 NOTE — Progress Notes (Signed)
Subjective:    Patient ID: Alexandra Baldwin, female    DOB: 1935-02-10, 77 y.o.   MRN: [id number]  HPI 77 yr old female for a cpx. She feels great and has no concerns. She is weaning off of Paxil as planned. She still exercises daily.    Review of Systems  Constitutional: Negative.  Negative for fever, diaphoresis, activity change, appetite change, fatigue and unexpected weight change.  HENT: Negative.  Negative for hearing loss, ear pain, nosebleeds, congestion, sore throat, trouble swallowing, neck pain, neck stiffness, voice change and tinnitus.   Eyes: Negative.  Negative for photophobia, pain, discharge, redness and visual disturbance.  Respiratory: Negative.  Negative for apnea, cough, choking, chest tightness, shortness of breath, wheezing and stridor.   Cardiovascular: Negative.  Negative for chest pain, palpitations and leg swelling.  Gastrointestinal: Negative.  Negative for nausea, vomiting, abdominal pain, diarrhea, constipation, blood in stool, abdominal distention and rectal pain.  Genitourinary: Negative.  Negative for dysuria, urgency, frequency, hematuria, flank pain, vaginal bleeding, vaginal discharge, enuresis, difficulty urinating, vaginal pain and menstrual problem.  Musculoskeletal: Negative.  Negative for myalgias, back pain, joint swelling, arthralgias and gait problem.  Skin: Negative.  Negative for color change, pallor, rash and wound.  Neurological: Negative.  Negative for dizziness, tremors, seizures, syncope, speech difficulty, weakness, light-headedness, numbness and headaches.  Hematological: Negative.  Negative for adenopathy. Does not bruise/bleed easily.  Psychiatric/Behavioral: Negative.  Negative for hallucinations, behavioral problems, confusion, sleep disturbance, dysphoric mood and agitation. The patient is not nervous/anxious.        Objective:   Physical Exam  Constitutional: She appears well-developed and well-nourished. No distress.  HENT:    Head: Normocephalic and atraumatic.  Right Ear: External ear normal.  Left Ear: External ear normal.  Nose: Nose normal.  Mouth/Throat: Oropharynx is clear and moist. No oropharyngeal exudate.  Eyes: Conjunctivae and EOM are normal. Pupils are equal, round, and reactive to light. Right eye exhibits no discharge. Left eye exhibits no discharge. No scleral icterus.  Neck: Normal range of motion. Neck supple. No JVD present. No thyromegaly present.  Cardiovascular: Normal rate, regular rhythm, normal heart sounds and intact distal pulses.  Exam reveals no gallop and no friction rub.   No murmur heard.      EKG normal   Pulmonary/Chest: Effort normal and breath sounds normal. No stridor. No respiratory distress. She has no wheezes. She has no rales. She exhibits no tenderness.  Abdominal: Soft. Normal appearance and bowel sounds are normal. She exhibits no distension, no abdominal bruit, no ascites and no mass. There is no hepatosplenomegaly. There is no tenderness. There is no rigidity, no rebound and no guarding. No hernia.  Genitourinary: Rectum normal, vagina normal and uterus normal. No breast swelling, tenderness, discharge or bleeding. Cervix exhibits no motion tenderness, no discharge and no friability. Right adnexum displays no mass, no tenderness and no fullness. Left adnexum displays no mass, no tenderness and no fullness. No erythema, tenderness or bleeding around the vagina. No vaginal discharge found.  Musculoskeletal: Normal range of motion. She exhibits no edema and no tenderness.  Lymphadenopathy:    She has no cervical adenopathy.  Neurological: She is alert. She has normal reflexes. No cranial nerve deficit. She exhibits normal muscle tone. Coordination normal.  Skin: Skin is warm and dry. No rash noted. She is not diaphoretic. No erythema. No pallor.  Psychiatric: She has a normal mood and affect. Her behavior is normal. Judgment and thought content normal.  Assessment & Plan:  Well exam.

## 2011-10-28 ENCOUNTER — Ambulatory Visit (INDEPENDENT_AMBULATORY_CARE_PROVIDER_SITE_OTHER): Payer: Medicare Other | Admitting: Family Medicine

## 2011-10-28 ENCOUNTER — Encounter: Payer: Self-pay | Admitting: Family Medicine

## 2011-10-28 VITALS — BP 122/70 | HR 83 | Temp 98.3°F | Wt 138.0 lb

## 2011-10-28 DIAGNOSIS — J45990 Exercise induced bronchospasm: Secondary | ICD-10-CM

## 2011-10-28 MED ORDER — ATORVASTATIN CALCIUM 10 MG PO TABS: 10.0000 mg | ORAL_TABLET | Freq: Every day | ORAL | Status: DC

## 2011-10-28 MED ORDER — ALBUTEROL SULFATE HFA 108 (90 BASE) MCG/ACT IN AERS: 2.0000 | INHALATION_SPRAY | RESPIRATORY_TRACT | Status: DC | PRN

## 2011-10-28 NOTE — Progress Notes (Signed)
  Subjective:    Patient ID: Alexandra Baldwin, female    DOB: [birth year], 77 y.o.   MRN: [id number]  HPI Here to discuss some mild episodes of chest tightness that she gets when outside running. She is training to run in a fun run this fall, and she runs 2-5 miles at a time. She usually runs indoors on her treadmill, but sometimes she runs outside. She has a hx of asthma, and she wonders if the recent heat and humidity has caused this to flare up. No chest pains. No coughing but she has mild wheezing at times. She has stopped all her HTN meds, and her BP is stable.   Review of Systems  Constitutional: Negative.   Respiratory: Positive for chest tightness. Negative for apnea, cough, choking, shortness of breath and stridor.   Cardiovascular: Negative.        Objective:   Physical Exam  Constitutional: She appears well-developed and well-nourished.  Neck: No thyromegaly present.  Cardiovascular: Normal rate, regular rhythm, normal heart sounds and intact distal pulses.   Pulmonary/Chest: Effort normal and breath sounds normal. No respiratory distress. She has no wheezes. She has no rales.  Lymphadenopathy:    She has no cervical adenopathy.          Assessment & Plan:  Possible exercise induced asthma. Try a Ventolin inhaler prior to exercising and then prn. Follow up prn

## 2011-11-01 ENCOUNTER — Telehealth: Payer: Self-pay | Admitting: Family Medicine

## 2011-11-01 NOTE — Telephone Encounter (Signed)
Caller: Haedyn/Patient; PCP: Nelwyn Salisbury.; CB#: [phone]; ; ; Call regarding Medication Question; seen in office 10/28/11 and given inhaler for her wheezing.  Per epic, given proventil to use for shortness of breath.  Lungs were clear per patient.  States she notes increased heart rate with the albuterol, which makes her very jittery and uncomfortable.  States she also is not helped by it in terms of exercising outdoors.  She wants to run a 5K and feels she is wheezing too hard, and this affects her mile time, even on the treadmill.  States Dr. Clent Ridges told her if this was not helpful, to let him know and he would prescribe an oral medication to try.  Patient states she used to be on singulair in the past.  Would like Dr. Clent Ridges to consider a different inhaler that will not make her as jittery; control the cough better; perhaps consider restarting singulair again; and would like samples for Nexium, as her insurance will not cover this, and it worked very well for her s/sx reflux.   USES [facility]/[address] MAY REACH PATIENT AT [phone] home  or work [phone].  May leave message.

## 2011-11-02 MED ORDER — MONTELUKAST SODIUM 10 MG PO TABS: 10.0000 mg | ORAL_TABLET | Freq: Every day | ORAL | Status: DC

## 2011-11-02 NOTE — Telephone Encounter (Signed)
I sent script e-scribe and left voice message for pt 

## 2011-11-02 NOTE — Telephone Encounter (Signed)
Sorry but we have no samples of Nexium. Get back on Singulair 10 mg a day. Call in one year supply

## 2012-02-28 ENCOUNTER — Ambulatory Visit (INDEPENDENT_AMBULATORY_CARE_PROVIDER_SITE_OTHER): Payer: Medicare Other | Admitting: Family Medicine

## 2012-02-28 ENCOUNTER — Encounter: Payer: Self-pay | Admitting: Family Medicine

## 2012-02-28 VITALS — BP 112/60 | HR 79 | Temp 98.4°F | Wt 128.0 lb

## 2012-02-28 DIAGNOSIS — L820 Inflamed seborrheic keratosis: Secondary | ICD-10-CM

## 2012-02-28 NOTE — Progress Notes (Signed)
  Subjective:    Patient ID: Alexandra Baldwin, female    DOB: Jul 08, 1934, 77 y.o.   MRN: [id number]  HPI Here for me to look at a lesion on her back that has been present for several years, but lately it has been irritated and bleeds some.    Review of Systems  Constitutional: Negative.        Objective:   Physical Exam  Constitutional: She appears well-developed and well-nourished.  Skin:       There is an inflamed seborrheic keratosis on the upper back           Assessment & Plan:  We will plan to remove this soon

## 2012-03-01 ENCOUNTER — Ambulatory Visit (INDEPENDENT_AMBULATORY_CARE_PROVIDER_SITE_OTHER): Payer: Medicare Other | Admitting: Family Medicine

## 2012-03-01 ENCOUNTER — Encounter: Payer: Self-pay | Admitting: Family Medicine

## 2012-03-01 VITALS — BP 114/58 | HR 80 | Temp 98.6°F | Wt 128.0 lb

## 2012-03-01 DIAGNOSIS — L820 Inflamed seborrheic keratosis: Secondary | ICD-10-CM

## 2012-03-01 NOTE — Progress Notes (Signed)
  Subjective:    Patient ID: Alexandra Baldwin, female    DOB: 1934-04-26, 77 y.o.   MRN: [id number]  HPI Here to remove an inflamed seborrheic keratosis from the upper back. This measures 2.7 cm in diameter.    Review of Systems  Constitutional: Negative.        Objective:   Physical Exam  Constitutional: She appears well-developed and well-nourished.          Assessment & Plan:  The area was cleansed with Betadine and we achieved LA using 2% Xylocaine with epinephrine. An elliptical incision was made around the lesion with a scalpel down to the subcutaneous fat level. The lesion was removed and sent to Pathology. The wound was closed using 7 sutures of 4-0 Ethilon. This was dressed with Neosporin and a Bandaid. She was given care instructions, and we will removed the sutures in 14 days.

## 2012-03-15 ENCOUNTER — Ambulatory Visit: Payer: Medicare Other | Admitting: Family Medicine

## 2012-05-14 ENCOUNTER — Other Ambulatory Visit: Payer: Self-pay | Admitting: Family Medicine

## 2012-05-14 NOTE — Telephone Encounter (Signed)
Okay to send in per Dr. Fry. 

## 2012-05-14 NOTE — Telephone Encounter (Signed)
Pt needs refill of albuterol (PROVENTIL HFA;VENTOLIN HFA) 108 (90 BASE) MCG/ACT , but pt needs  the PRO AIR brand. Pt states her insurance will only pay for this brand. Pharm tried to fill with VENTOLIN.  Pharm: [facility]/ [place]

## 2012-05-16 NOTE — Telephone Encounter (Signed)
I spoke with pt and she already has the script for [employer].

## 2012-08-07 ENCOUNTER — Ambulatory Visit (INDEPENDENT_AMBULATORY_CARE_PROVIDER_SITE_OTHER): Payer: Medicare Other | Admitting: Ophthalmology

## 2012-08-07 DIAGNOSIS — H3530 Unspecified macular degeneration: Secondary | ICD-10-CM

## 2012-08-07 DIAGNOSIS — H33009 Unspecified retinal detachment with retinal break, unspecified eye: Secondary | ICD-10-CM

## 2012-08-07 DIAGNOSIS — H43819 Vitreous degeneration, unspecified eye: Secondary | ICD-10-CM

## 2012-09-18 ENCOUNTER — Encounter: Payer: Self-pay | Admitting: Family Medicine

## 2012-09-18 ENCOUNTER — Ambulatory Visit (INDEPENDENT_AMBULATORY_CARE_PROVIDER_SITE_OTHER): Payer: Medicare Other | Admitting: Family Medicine

## 2012-09-18 VITALS — BP 124/60 | HR 111 | Temp 99.0°F | Wt 119.0 lb

## 2012-09-18 DIAGNOSIS — F411 Generalized anxiety disorder: Secondary | ICD-10-CM

## 2012-09-18 DIAGNOSIS — I10 Essential (primary) hypertension: Secondary | ICD-10-CM

## 2012-09-18 DIAGNOSIS — E785 Hyperlipidemia, unspecified: Secondary | ICD-10-CM

## 2012-09-18 LAB — BASIC METABOLIC PANEL
BUN: 15 mg/dL (ref 6–23)
Calcium: 9.3 mg/dL (ref 8.4–10.5)
Creatinine, Ser: 0.9 mg/dL (ref 0.4–1.2)
GFR: 66.45 mL/min (ref >60.00)

## 2012-09-18 LAB — POCT URINALYSIS DIPSTICK
Bilirubin, UA: NEGATIVE
Ketones, UA: NEGATIVE
Protein, UA: NEGATIVE

## 2012-09-18 LAB — TSH: TSH: 1.29 u[IU]/mL (ref 0.35–5.50)

## 2012-09-18 LAB — CBC WITH DIFFERENTIAL/PLATELET
Basophils Absolute: 0 10*3/uL (ref 0.0–0.1)
Hemoglobin: 13 g/dL (ref 12.0–15.0)
Lymphocytes Relative: 15.2 % (ref 12.0–46.0)
Monocytes Relative: 9.3 % (ref 3.0–12.0)
Neutro Abs: 6.2 10*3/uL (ref 1.4–7.7)
RBC: 4.03 Mil/uL (ref 3.87–5.11)
RDW: 13.6 % (ref 11.5–14.6)

## 2012-09-18 LAB — LIPID PANEL
Cholesterol: 178 mg/dL (ref 0–200)
LDL Cholesterol: 93 mg/dL (ref 0–99)
Triglycerides: 91 mg/dL (ref 0.0–149.0)
VLDL: 18.2 mg/dL (ref 0.0–40.0)

## 2012-09-18 LAB — HEPATIC FUNCTION PANEL: Total Bilirubin: 0.7 mg/dL (ref 0.3–1.2)

## 2012-09-18 MED ORDER — ZOLPIDEM TARTRATE 5 MG PO TABS: 5.0000 mg | ORAL_TABLET | Freq: Every evening | ORAL | Status: DC | PRN

## 2012-09-18 MED ORDER — MONTELUKAST SODIUM 10 MG PO TABS: 10.0000 mg | ORAL_TABLET | Freq: Every day | ORAL | Status: DC

## 2012-09-18 MED ORDER — PAROXETINE HCL 10 MG PO TABS: 10.0000 mg | ORAL_TABLET | ORAL | Status: DC

## 2012-09-18 NOTE — Progress Notes (Signed)
  Subjective:    Patient ID: Alexandra Baldwin, female    DOB: 08-27-1934, 78 y.o.   MRN: [id number]  HPI Here for refills and to discuss things. She wants to top taking Lipitor. She is fasting today for labs. She needs refills on Zolpidem and Paxil. Her stress is fairly well controlled.    Review of Systems  Constitutional: Negative.   Respiratory: Negative.   Cardiovascular: Negative.   Psychiatric/Behavioral: Negative.        Objective:   Physical Exam  Constitutional: She is oriented to person, place, and time. She appears well-developed and well-nourished.  Cardiovascular: Normal rate, regular rhythm, normal heart sounds and intact distal pulses.   Pulmonary/Chest: Effort normal and breath sounds normal.  Neurological: She is alert and oriented to person, place, and time.  Psychiatric: She has a normal mood and affect. Her behavior is normal. Thought content normal.          Assessment & Plan:  Get labs. Stop Lipitor. Refilled meds as above

## 2012-09-21 NOTE — Progress Notes (Signed)
Quick Note:  I spoke with pt ______ 

## 2012-10-28 ENCOUNTER — Other Ambulatory Visit: Payer: Self-pay | Admitting: Family Medicine

## 2013-04-15 ENCOUNTER — Other Ambulatory Visit: Payer: Self-pay | Admitting: Family Medicine

## 2013-04-15 NOTE — Telephone Encounter (Signed)
Call in #30 WITH 5 RF

## 2013-05-08 ENCOUNTER — Telehealth: Payer: Self-pay | Admitting: Family Medicine

## 2013-05-08 NOTE — Telephone Encounter (Signed)
Patient Information:  Caller Name: Merina  Phone: [phone]  Patient: Alexandra Baldwin, Alexandra Baldwin  Gender: Female  DOB: [birth year]  Age: 79 Years  PCP: [name] [facility])  Office Follow Up:  Does the office need to follow up with this patient?: No  Instructions For The Office: N/A   Symptoms  Reason For Call & Symptoms: Cindie says she was holding a present this am and a pair of scissors jabbed into the left leg near the knee; says it was about 1/8 or 1/4 of an inch; stopped bleeding; cleaned and used Neosporin; bruised now; says she has not had a Tetanus shot in 51yrs  Reviewed Health History In EMR: Yes  Reviewed Medications In EMR: Yes  Reviewed Allergies In EMR: Yes  Reviewed Surgeries / Procedures: Yes  Date of Onset of Symptoms: 05/08/2013  Guideline(s) Used:  Puncture Wound  Disposition Per Guideline:   See Today in Office  Reason For Disposition Reached:   No tetanus booster in > 5 years  Advice Given:  Antibiotic Ointment:  Apply an antibiotic ointment and a Band-Aid to reduce the risk of infection. Re-soak the area and reapply an antibiotic ointment every 12 hours for 2 days.  Pain Medicines:  For pain relief, you can take either acetaminophen, ibuprofen, or naproxen.  Expected Course:  Puncture wounds seal over in 1 to 2 hours. Pain should resolve within 2 days.  Call Back If:  It begins to look infected (redness, red streaks, tenderness, pus, fever)  You become worse.  Patient Will Follow Care Advice:  YES  Appointment Scheduled:  05/09/2013 10:30:00 Appointment Scheduled Provider:  [name] [facility])

## 2013-05-09 ENCOUNTER — Ambulatory Visit (INDEPENDENT_AMBULATORY_CARE_PROVIDER_SITE_OTHER): Payer: Medicare HMO | Admitting: Family Medicine

## 2013-05-09 ENCOUNTER — Encounter: Payer: Self-pay | Admitting: Family Medicine

## 2013-05-09 VITALS — BP 128/56 | HR 94 | Temp 98.2°F | Wt 122.0 lb

## 2013-05-09 DIAGNOSIS — S81809A Unspecified open wound, unspecified lower leg, initial encounter: Secondary | ICD-10-CM

## 2013-05-09 DIAGNOSIS — Z23 Encounter for immunization: Secondary | ICD-10-CM

## 2013-05-09 DIAGNOSIS — S81812A Laceration without foreign body, left lower leg, initial encounter: Secondary | ICD-10-CM

## 2013-05-09 NOTE — Progress Notes (Signed)
   Subjective:    Patient ID: Alexandra Baldwin, female    DOB: [birth year], 79 y.o.   MRN: [id number]  HPI Here to check a puncture wound on the left medial knee area which occurred yesterday. She was picking up a box and the tip of some scissors fell against her leg. She washed the area and applied Neosporin. Today it is slightly sore only.    Review of Systems  Constitutional: Negative.   Skin: Positive for wound.       Objective:   Physical Exam  Constitutional: She appears well-developed and well-nourished.  Skin:  Small puncture wound on the left knee, small ecchymosis, not tender or warm          Assessment & Plan:  The wound looks clean with no signs of infection. Given a TDaP. Recheck prn

## 2013-05-09 NOTE — Telephone Encounter (Signed)
FYI

## 2013-05-09 NOTE — Progress Notes (Signed)
Pre visit review using our clinic review tool, if applicable. No additional management support is needed unless otherwise documented below in the visit note. 

## 2013-05-09 NOTE — Telephone Encounter (Signed)
She was seen in the office today.

## 2013-08-07 ENCOUNTER — Ambulatory Visit (INDEPENDENT_AMBULATORY_CARE_PROVIDER_SITE_OTHER): Payer: Medicare HMO | Admitting: Ophthalmology

## 2013-08-07 DIAGNOSIS — H33009 Unspecified retinal detachment with retinal break, unspecified eye: Secondary | ICD-10-CM

## 2013-08-07 DIAGNOSIS — H33309 Unspecified retinal break, unspecified eye: Secondary | ICD-10-CM

## 2013-08-07 DIAGNOSIS — H3530 Unspecified macular degeneration: Secondary | ICD-10-CM

## 2013-08-07 DIAGNOSIS — H43819 Vitreous degeneration, unspecified eye: Secondary | ICD-10-CM

## 2013-09-29 ENCOUNTER — Other Ambulatory Visit: Payer: Self-pay | Admitting: Family Medicine

## 2013-09-30 NOTE — Telephone Encounter (Signed)
Can we refill the Paxil?

## 2013-10-01 NOTE — Telephone Encounter (Signed)
Ok to refill for 1 month  Further refills per dr Sarajane Jews pcp

## 2013-10-14 ENCOUNTER — Encounter: Payer: Self-pay | Admitting: Physician Assistant

## 2013-10-14 ENCOUNTER — Ambulatory Visit (INDEPENDENT_AMBULATORY_CARE_PROVIDER_SITE_OTHER): Payer: Medicare HMO | Admitting: Physician Assistant

## 2013-10-14 VITALS — BP 102/70 | HR 69 | Temp 97.8°F | Resp 18 | Wt 122.0 lb

## 2013-10-14 DIAGNOSIS — T63451A Toxic effect of venom of hornets, accidental (unintentional), initial encounter: Principal | ICD-10-CM

## 2013-10-14 DIAGNOSIS — T6391XA Toxic effect of contact with unspecified venomous animal, accidental (unintentional), initial encounter: Secondary | ICD-10-CM

## 2013-10-14 DIAGNOSIS — T63441A Toxic effect of venom of bees, accidental (unintentional), initial encounter: Secondary | ICD-10-CM

## 2013-10-14 DIAGNOSIS — T63461A Toxic effect of venom of wasps, accidental (unintentional), initial encounter: Secondary | ICD-10-CM

## 2013-10-14 MED ORDER — EPINEPHRINE 0.3 MG/0.3ML IJ SOAJ: 0.3000 mg | Freq: Once | INTRAMUSCULAR | Status: DC

## 2013-10-14 MED ORDER — METHYLPREDNISOLONE ACETATE 80 MG/ML IJ SUSP
80.0000 mg | Freq: Once | INTRAMUSCULAR | Status: AC
  Administered 2013-10-14: 80 mg via INTRAMUSCULAR

## 2013-10-14 NOTE — Patient Instructions (Signed)
We have ordered an EpiPen Freeda have on hand due to your history of anaphylaxis. We have also ordered refills for this if you need them.  Continue to watch the areas for improvement. Return if swelling redness pain and warmth increase rather than improve.  If emergency symptoms discussed during visit developed, seek medical attention immediately.  Followup as needed, or for worsening or persistent symptoms despite treatment.   Bee, Wasp, or Hornet Sting Your caregiver has diagnosed you as having an insect sting. An insect sting appears as a red lump in the skin that sometimes has a tiny hole in the center, or it may have a stinger in the center of the wound. The most common stings are from wasps, hornets and bees. Individuals have different reactions to insect stings.  A normal reaction may cause pain, swelling, and redness around the sting site.  A localized allergic reaction may cause swelling and redness that extends beyond the sting site.  A large local reaction may continue to develop over the next 12 to 36 hours.  On occasion, the reactions can be severe (anaphylactic reaction). An anaphylactic reaction may cause wheezing; difficulty breathing; chest pain; fainting; raised, itchy, red patches on the skin; a sick feeling to your stomach (nausea); vomiting; cramping; or diarrhea. If you have had an anaphylactic reaction to an insect sting in the past, you are more likely to have one again. HOME CARE INSTRUCTIONS   With bee stings, a small sac of poison is left in the wound. Brushing across this with something such as a credit card, or anything similar, will help remove this and decrease the amount of the reaction. This same procedure will not help a wasp sting as they do not leave behind a stinger and poison sac.  Apply a cold compress for 10 to 20 minutes every hour for 1 to 2 days, depending on severity, to reduce swelling and itching.  To lessen pain, a paste made of water and  baking soda may be rubbed on the bite or sting and left on for 5 minutes.  To relieve itching and swelling, you may use take medication or apply medicated creams or lotions as directed.  Only take over-the-counter or prescription medicines for pain, discomfort, or fever as directed by your caregiver.  Wash the sting site daily with soap and water. Apply antibiotic ointment on the sting site as directed.  If you suffered a severe reaction:  If you did not require hospitalization, an adult will need to stay with you for 24 hours in case the symptoms return.  You may need to wear a medical bracelet or necklace stating the allergy.  You and your family need to learn when and how to use an anaphylaxis kit or epinephrine injection.  If you have had a severe reaction before, always carry your anaphylaxis kit with you. SEEK MEDICAL CARE IF:   None of the above helps within 2 to 3 days.  The area becomes red, warm, tender, and swollen beyond the area of the bite or sting.  You have an oral temperature above 102 F (38.9 C). SEEK IMMEDIATE MEDICAL CARE IF:  You have symptoms of an allergic reaction which are:  Wheezing.  Difficulty breathing.  Chest pain.  Lightheadedness or fainting.  Itchy, raised, red patches on the skin.  Nausea, vomiting, cramping or diarrhea. ANY OF THESE SYMPTOMS MAY REPRESENT A SERIOUS PROBLEM THAT IS AN EMERGENCY. Do not wait to see if the symptoms will go away. Get  medical help right away. Call your local emergency services (911 in [place] DO NOT drive yourself to the hospital. MAKE SURE YOU:   Understand these instructions.  Will watch your condition.  Will get help right away if you are not doing well or get worse. Document Released: 03/28/2005 Document Revised: 06/20/2011 Document Reviewed: 09/12/2009 [facility] Patient Information 2015 [place], [place]. This information is not intended to replace advice given to you by your health care provider. Make  sure you discuss any questions you have with your health care provider.

## 2013-10-14 NOTE — Progress Notes (Signed)
Subjective:    Patient ID: Alexandra Baldwin, female    DOB: April 22, 1934, 79 y.o.   MRN: [id number]  HPI Patient is a 79 y.o. female presenting for bee sting. Pt states this occurred yesterday. She was stung on the right hand, right elbow, left gluteal region, and top of her left foot. She states that she took benadryl for this yesterday which she states helped her difficulty breathing and slowed her heart rate. Pt is concerned because she has had an anaphylactic response in the past to an allergy shot, and felt that her symptoms were similar to anaphylaxis, which prompted her to use benadryl. She states that today she is not having any difficulty breathing and she is mostly not in pain, she is experiencing a lot of itching however, and she feels like the redness and swelling are spreading still. The only spot that is painful is the one on her left gluteal area. She also has a slight headache. She denies F/C/N/V/D/SOB/CP/Syncope.  Review of Systems As per history of present illness and are otherwise negative.   Past Medical History  Diagnosis Date  . Allergy   . Hyperlipidemia   . Multiple thyroid nodules 2004    negative by biopsy, [place], and uptake scan  . Osteopenia     per DEXA 08-2008  . Insomnia   . Hematuria, microscopic     benign, per Dr. Karsten Ro 2009  . Gynecological examination     sees Dr. Radene Knee     History   Social History  . Marital Status: Widowed    Spouse Name: N/A    Number of Children: N/A  . Years of Education: N/A   Occupational History  . Not on file.   Social History Main Topics  . Smoking status: Never Smoker   . Smokeless tobacco: Never Used  . Alcohol Use: No  . Drug Use: No  . Sexual Activity: Not on file   Other Topics Concern  . Not on file   Social History Narrative  . No narrative on file    Past Surgical History  Procedure Laterality Date  . Cataract extraction      bilateral per Dr. Dolores Lory   . Hernia repair  6237    umbilical  .  Retinal detachment surgery      bilateral with laser  . Colonoscopy  07-2008    per Dr. Danise Mina, repeat in 5 yrs    No family history on file.  No Known Allergies  Current Outpatient Prescriptions on File Prior to Visit  Medication Sig Dispense Refill  . atorvastatin (LIPITOR) 10 MG tablet TAKE ONE TABLET BY MOUTH ONE TIME DAILY  30 tablet  5  . montelukast (SINGULAIR) 10 MG tablet TAKE ONE TABLET BY MOUTH NIGHTLY AT BEDTIME   30 tablet  3  . PARoxetine (PAXIL) 10 MG tablet TAKE ONE TABLET BY MOUTH EVERY MORNING   30 tablet  0  . zolpidem (AMBIEN) 5 MG tablet TAKE ONE TABLET BY MOUTH AT BEDTIME AS NEEDED FOR SLEEP   30 tablet  5   No current facility-administered medications on file prior to visit.    EXAM: BP 102/70  Pulse 69  Temp(Src) 97.8 F (36.6 C) (Oral)  Resp 18  Wt 122 lb (55.339 kg)  SpO2 94%     Objective:   Physical Exam  Nursing note and vitals reviewed. Constitutional: She is oriented to person, place, and time. She appears well-developed and well-nourished. No distress.  HENT:  Head: Normocephalic and atraumatic.  Eyes: Conjunctivae and EOM are normal. Pupils are equal, round, and reactive to light.  Neck: Normal range of motion.  Cardiovascular: Normal rate, regular rhythm and intact distal pulses.   Pulmonary/Chest: Effort normal and breath sounds normal. No respiratory distress. She exhibits no tenderness.  Musculoskeletal: Normal range of motion. She exhibits no edema.  Neurological: She is alert and oriented to person, place, and time.  Skin: Skin is warm and dry. No rash noted. She is not diaphoretic. There is erythema. No pallor.  Areas of local swelling and inflammation to the right hand, right medial elbow, left gluteal region, left dorsal foot. These areas are warm to the touch. There is no pain to palpation except for over the left gluteal region. No fluctuance. No visible foreign body.  Psychiatric: She has a normal mood and affect. Her behavior  is normal. Judgment and thought content normal.    Lab Results  Component Value Date   WBC 8.4 09/18/2012   HGB 13.0 09/18/2012   HCT 39.1 09/18/2012   PLT 342.0 09/18/2012   GLUCOSE 86 09/18/2012   CHOL 178 09/18/2012   TRIG 91.0 09/18/2012   HDL 67.30 09/18/2012   LDLCALC 93 09/18/2012   ALT 15 09/18/2012   AST 21 09/18/2012   NA 141 09/18/2012   K 4.4 09/18/2012   CL 106 09/18/2012   CREATININE 0.9 09/18/2012   BUN 15 09/18/2012   CO2 25 09/18/2012   TSH 1.29 09/18/2012         Assessment & Plan:  Alexandra Baldwin was seen today for insect bite.  Diagnoses and associated orders for this visit:  Sting from hornet, wasp, or bee, accidental or unintentional, initial encounter Comments: Multiple locations, severe reaction, will give IM depomedrol, will Rx new epi pen due to Hx of anaphylaxis. - EPINEPHrine (EPIPEN) 0.3 mg/0.3 mL IJ SOAJ injection; Inject 0.3 mLs (0.3 mg total) into the muscle once. - methylPREDNISolone acetate (DEPO-MEDROL) injection 80 mg; Inject 1 mL (80 mg total) into the muscle once.    Likely representative of local allergic type reaction. No evidence of infection at this time. Patient will continue to monitor at home for any signs of infection discussed during visit including persistent or increased swelling, redness, warmth, pain, or fever develops.  EpiPen provided due to patient history of anaphylactic reaction.  Return precautions provided, and patient handout on bee stings.  Plan to follow up as needed, or for worsening or persistent symptoms despite treatment.  Patient Instructions  We have ordered an EpiPen Freeda have on hand due to your history of anaphylaxis. We have also ordered refills for this if you need them.  Continue to watch the areas for improvement. Return if swelling redness pain and warmth increase rather than improve.  If emergency symptoms discussed during visit developed, seek medical attention immediately.  Followup as needed, or for  worsening or persistent symptoms despite treatment.

## 2013-10-14 NOTE — Progress Notes (Signed)
Pre visit review using our clinic review tool, if applicable. No additional management support is needed unless otherwise documented below in the visit note. 

## 2013-11-08 ENCOUNTER — Ambulatory Visit (INDEPENDENT_AMBULATORY_CARE_PROVIDER_SITE_OTHER): Payer: Medicare HMO | Admitting: Family Medicine

## 2013-11-08 ENCOUNTER — Encounter: Payer: Self-pay | Admitting: Family Medicine

## 2013-11-08 VITALS — BP 126/64 | HR 93 | Temp 98.8°F | Ht 60.75 in | Wt 123.0 lb

## 2013-11-08 DIAGNOSIS — R519 Headache, unspecified: Secondary | ICD-10-CM | POA: Insufficient documentation

## 2013-11-08 DIAGNOSIS — R51 Headache: Secondary | ICD-10-CM

## 2013-11-08 MED ORDER — SUMATRIPTAN SUCCINATE 100 MG PO TABS: 100.0000 mg | ORAL_TABLET | ORAL | Status: DC | PRN

## 2013-11-08 MED ORDER — KETOROLAC TROMETHAMINE 60 MG/2ML IM SOLN
60.0000 mg | Freq: Once | INTRAMUSCULAR | Status: AC
  Administered 2013-11-08: 60 mg via INTRAMUSCULAR

## 2013-11-08 MED ORDER — PROMETHAZINE HCL 25 MG PO TABS: 25.0000 mg | ORAL_TABLET | Freq: Four times a day (QID) | ORAL | Status: DC | PRN

## 2013-11-08 NOTE — Addendum Note (Signed)
Addended by: Aggie Hacker A on: 11/08/2013 02:48 PM   Modules accepted: Orders

## 2013-11-08 NOTE — Progress Notes (Signed)
   Subjective:    Patient ID: Alexandra Baldwin, female    DOB: 1935-03-24, 79 y.o.   MRN: [id number]  HPI Here for frequent headaches. She has been getting HAs about once a week for the past few months, and now she has had one for 3 days in a row. She is sensitive to lights and loud noises and she gets nauseous with them. No vision changes or any other neurologic deficits. She had a normal brain MRI in 2012. Aleve helps a little.    Review of Systems  Constitutional: Negative.   Neurological: Positive for headaches. Negative for dizziness, tremors, seizures, syncope, facial asymmetry, speech difficulty, weakness, light-headedness and numbness.       Objective:   Physical Exam  Constitutional: She is oriented to person, place, and time. She appears well-developed and well-nourished. No distress.  HENT:  Head: Normocephalic and atraumatic.  Eyes: Conjunctivae and EOM are normal. Pupils are equal, round, and reactive to light.  Neck: Neck supple. No thyromegaly present.  Cardiovascular: Normal rate, regular rhythm, normal heart sounds and intact distal pulses.   Pulmonary/Chest: Effort normal and breath sounds normal.  Lymphadenopathy:    She has no cervical adenopathy.  Neurological: She is alert and oriented to person, place, and time. She has normal reflexes. No cranial nerve deficit. She exhibits normal muscle tone. Coordination normal.          Assessment & Plan:  Probable migraines. Try Imitrex. Given a shot of Toradol.

## 2013-11-08 NOTE — Progress Notes (Signed)
Pre visit review using our clinic review tool, if applicable. No additional management support is needed unless otherwise documented below in the visit note. 

## 2013-12-10 ENCOUNTER — Other Ambulatory Visit (INDEPENDENT_AMBULATORY_CARE_PROVIDER_SITE_OTHER): Payer: Medicare HMO

## 2013-12-10 DIAGNOSIS — Z0000 Encounter for general adult medical examination without abnormal findings: Secondary | ICD-10-CM

## 2013-12-10 DIAGNOSIS — E785 Hyperlipidemia, unspecified: Secondary | ICD-10-CM

## 2013-12-10 LAB — LIPID PANEL
CHOLESTEROL: 296 mg/dL — AB (ref 0–200)
HDL: 70.7 mg/dL (ref >39.00)
LDL Cholesterol: 199 mg/dL — ABNORMAL HIGH (ref 0–99)
NONHDL: 225.3
TRIGLYCERIDES: 133 mg/dL (ref 0.0–149.0)
Total CHOL/HDL Ratio: 4
VLDL: 26.6 mg/dL (ref 0.0–40.0)

## 2013-12-10 LAB — CBC WITH DIFFERENTIAL/PLATELET
BASOS ABS: 0 10*3/uL (ref 0.0–0.1)
Basophils Relative: 0.5 % (ref 0.0–3.0)
EOS ABS: 0.1 10*3/uL (ref 0.0–0.7)
Eosinophils Relative: 1.1 % (ref 0.0–5.0)
HCT: 38.7 % (ref 36.0–46.0)
Hemoglobin: 13 g/dL (ref 12.0–15.0)
LYMPHS PCT: 19.4 % (ref 12.0–46.0)
Lymphs Abs: 1.4 10*3/uL (ref 0.7–4.0)
MCHC: 33.7 g/dL (ref 30.0–36.0)
MCV: 96.7 fl (ref 78.0–100.0)
MONOS PCT: 9.3 % (ref 3.0–12.0)
Monocytes Absolute: 0.7 10*3/uL (ref 0.1–1.0)
NEUTROS PCT: 69.7 % (ref 43.0–77.0)
Neutro Abs: 4.9 10*3/uL (ref 1.4–7.7)
PLATELETS: 382 10*3/uL (ref 150.0–400.0)
RBC: 4.01 Mil/uL (ref 3.87–5.11)
RDW: 13.8 % (ref 11.5–15.5)
WBC: 7 10*3/uL (ref 4.0–10.5)

## 2013-12-10 LAB — POCT URINALYSIS DIPSTICK
BILIRUBIN UA: NEGATIVE
GLUCOSE UA: NEGATIVE
KETONES UA: NEGATIVE
NITRITE UA: NEGATIVE
Protein, UA: NEGATIVE
SPEC GRAV UA: 1.015
Urobilinogen, UA: 0.2
pH, UA: 7

## 2013-12-10 LAB — HEPATIC FUNCTION PANEL
ALK PHOS: 43 U/L (ref 39–117)
ALT: 17 U/L (ref 0–35)
AST: 20 U/L (ref 0–37)
Albumin: 3.6 g/dL (ref 3.5–5.2)
BILIRUBIN DIRECT: 0 mg/dL (ref 0.0–0.3)
BILIRUBIN TOTAL: 0.6 mg/dL (ref 0.2–1.2)
Total Protein: 6.8 g/dL (ref 6.0–8.3)

## 2013-12-10 LAB — BASIC METABOLIC PANEL
BUN: 19 mg/dL (ref 6–23)
CALCIUM: 9.1 mg/dL (ref 8.4–10.5)
CO2: 28 meq/L (ref 19–32)
CREATININE: 0.9 mg/dL (ref 0.4–1.2)
Chloride: 103 mEq/L (ref 96–112)
GFR: 68.93 mL/min (ref >60.00)
GLUCOSE: 91 mg/dL (ref 70–99)
Potassium: 4 mEq/L (ref 3.5–5.1)
Sodium: 137 mEq/L (ref 135–145)

## 2013-12-10 LAB — TSH: TSH: 0.55 u[IU]/mL (ref 0.35–4.50)

## 2013-12-11 ENCOUNTER — Other Ambulatory Visit: Payer: Medicare HMO

## 2013-12-24 ENCOUNTER — Ambulatory Visit (INDEPENDENT_AMBULATORY_CARE_PROVIDER_SITE_OTHER): Payer: Medicare HMO | Admitting: Family Medicine

## 2013-12-24 ENCOUNTER — Encounter: Payer: Self-pay | Admitting: Family Medicine

## 2013-12-24 VITALS — BP 114/62 | HR 86 | Temp 98.5°F | Ht 60.75 in | Wt 122.0 lb

## 2013-12-24 DIAGNOSIS — Z0000 Encounter for general adult medical examination without abnormal findings: Secondary | ICD-10-CM

## 2013-12-24 DIAGNOSIS — Z23 Encounter for immunization: Secondary | ICD-10-CM

## 2013-12-24 DIAGNOSIS — E785 Hyperlipidemia, unspecified: Secondary | ICD-10-CM

## 2013-12-24 MED ORDER — PAROXETINE HCL 10 MG PO TABS: ORAL_TABLET | ORAL | Status: DC

## 2013-12-24 MED ORDER — MONTELUKAST SODIUM 10 MG PO TABS: ORAL_TABLET | ORAL | Status: DC

## 2013-12-24 MED ORDER — ATORVASTATIN CALCIUM 20 MG PO TABS: 20.0000 mg | ORAL_TABLET | Freq: Every day | ORAL | Status: DC

## 2013-12-24 NOTE — Progress Notes (Signed)
Pre visit review using our clinic review tool, if applicable. No additional management support is needed unless otherwise documented below in the visit note. 

## 2013-12-24 NOTE — Progress Notes (Signed)
   Subjective:    Patient ID: Alexandra Baldwin, female    DOB: 08/01/1934, 79 y.o.   MRN: [id number]  HPI 79 yr old female for a cpx. She feels great. She had stopped her Lipitor last winter and her LDL has jumped up. She is interested in starting it again but at a lower dose.    Review of Systems  Constitutional: Negative.   HENT: Negative.   Eyes: Negative.   Respiratory: Negative.   Cardiovascular: Negative.   Gastrointestinal: Negative.   Genitourinary: Negative for dysuria, urgency, frequency, hematuria, flank pain, decreased urine volume, enuresis, difficulty urinating, pelvic pain and dyspareunia.  Musculoskeletal: Negative.   Skin: Negative.   Neurological: Negative.   Psychiatric/Behavioral: Negative.        Objective:   Physical Exam  Constitutional: She is oriented to person, place, and time. She appears well-developed and well-nourished. No distress.  HENT:  Head: Normocephalic and atraumatic.  Right Ear: External ear normal.  Left Ear: External ear normal.  Nose: Nose normal.  Mouth/Throat: Oropharynx is clear and moist. No oropharyngeal exudate.  Eyes: Conjunctivae and EOM are normal. Pupils are equal, round, and reactive to light. No scleral icterus.  Neck: Normal range of motion. Neck supple. No JVD present. No thyromegaly present.  Cardiovascular: Normal rate, regular rhythm, normal heart sounds and intact distal pulses.  Exam reveals no gallop and no friction rub.   No murmur heard. EKG normal   Pulmonary/Chest: Effort normal and breath sounds normal. No respiratory distress. She has no wheezes. She has no rales. She exhibits no tenderness.  Abdominal: Soft. Bowel sounds are normal. She exhibits no distension and no mass. There is no tenderness. There is no rebound and no guarding.  Musculoskeletal: Normal range of motion. She exhibits no edema and no tenderness.  Lymphadenopathy:    She has no cervical adenopathy.  Neurological: She is alert and oriented to  person, place, and time. She has normal reflexes. No cranial nerve deficit. She exhibits normal muscle tone. Coordination normal.  Skin: Skin is warm and dry. No rash noted. No erythema.  Psychiatric: She has a normal mood and affect. Her behavior is normal. Judgment and thought content normal.          Assessment & Plan:  Well exam. Get back on Lipitor 20 mg to take only twice a week.

## 2014-03-31 ENCOUNTER — Ambulatory Visit: Payer: Medicare HMO | Admitting: Podiatry

## 2014-06-05 ENCOUNTER — Ambulatory Visit (INDEPENDENT_AMBULATORY_CARE_PROVIDER_SITE_OTHER): Payer: PPO | Admitting: Family Medicine

## 2014-06-05 VITALS — BP 127/60 | HR 83 | Temp 98.5°F | Ht 60.75 in | Wt 128.0 lb

## 2014-06-05 DIAGNOSIS — J0100 Acute maxillary sinusitis, unspecified: Secondary | ICD-10-CM

## 2014-06-05 MED ORDER — AMOXICILLIN-POT CLAVULANATE 875-125 MG PO TABS: 1.0000 | ORAL_TABLET | Freq: Two times a day (BID) | ORAL | Status: DC

## 2014-06-05 NOTE — Progress Notes (Signed)
Pre visit review using our clinic review tool, if applicable. No additional management support is needed unless otherwise documented below in the visit note. 

## 2014-06-06 ENCOUNTER — Encounter: Payer: Self-pay | Admitting: Family Medicine

## 2014-06-06 NOTE — Progress Notes (Signed)
   Subjective:    Patient ID: Alexandra Baldwin, female    DOB: [birth year], 80 y.o.   MRN: [id number]  HPI Here for one month of sinus pressure PND and a dry cough.    Review of Systems  Constitutional: Negative.   HENT: Positive for postnasal drip and sinus pressure.   Eyes: Negative.   Respiratory: Positive for cough.        Objective:   Physical Exam  Constitutional: She appears well-developed and well-nourished.  HENT:  Right Ear: External ear normal.  Left Ear: External ear normal.  Nose: Nose normal.  Mouth/Throat: Oropharynx is clear and moist.  Eyes: Conjunctivae are normal.  Pulmonary/Chest: Effort normal and breath sounds normal.  Lymphadenopathy:    She has no cervical adenopathy.          Assessment & Plan:  Add Mucinex

## 2014-06-25 ENCOUNTER — Other Ambulatory Visit: Payer: Self-pay | Admitting: Obstetrics and Gynecology

## 2014-06-26 LAB — CYTOLOGY - PAP

## 2014-08-08 ENCOUNTER — Ambulatory Visit (INDEPENDENT_AMBULATORY_CARE_PROVIDER_SITE_OTHER): Payer: Medicare HMO | Admitting: Ophthalmology

## 2015-01-28 ENCOUNTER — Ambulatory Visit (INDEPENDENT_AMBULATORY_CARE_PROVIDER_SITE_OTHER): Payer: PPO | Admitting: Family Medicine

## 2015-01-28 ENCOUNTER — Encounter: Payer: Self-pay | Admitting: Family Medicine

## 2015-01-28 VITALS — BP 132/63 | HR 96 | Temp 98.0°F | Ht 60.75 in | Wt 132.0 lb

## 2015-01-28 DIAGNOSIS — J019 Acute sinusitis, unspecified: Secondary | ICD-10-CM | POA: Diagnosis not present

## 2015-01-28 MED ORDER — METHYLPREDNISOLONE 4 MG PO TBPK: ORAL_TABLET | ORAL | Status: DC

## 2015-01-28 MED ORDER — AMOXICILLIN-POT CLAVULANATE 875-125 MG PO TABS: 1.0000 | ORAL_TABLET | Freq: Two times a day (BID) | ORAL | Status: DC

## 2015-01-28 NOTE — Progress Notes (Signed)
   Subjective:    Patient ID: Alexandra Baldwin, female    DOB: May 17, 1934, 80 y.o.   MRN: [id number]  HPI Here for one week of sinus pressure, PND, ST , and blowing green mucus from the nose. No coughing or fever.    Review of Systems  Constitutional: Negative.   HENT: Positive for congestion, postnasal drip, sinus pressure and sore throat.   Eyes: Negative.   Respiratory: Negative.        Objective:   Physical Exam  Constitutional: She appears well-developed and well-nourished.  HENT:  Right Ear: External ear normal.  Left Ear: External ear normal.  Nose: Nose normal.  Mouth/Throat: Oropharynx is clear and moist.  Eyes: Conjunctivae are normal.  Neck: No thyromegaly present.  Cardiovascular: Normal rate, regular rhythm, normal heart sounds and intact distal pulses.   Pulmonary/Chest: Effort normal and breath sounds normal.  Lymphadenopathy:    She has no cervical adenopathy.          Assessment & Plan:  Sinusitis, treat with Augmentin and a Medrol dose pack

## 2015-01-28 NOTE — Progress Notes (Signed)
Pre visit review using our clinic review tool, if applicable. No additional management support is needed unless otherwise documented below in the visit note. 

## 2015-02-03 ENCOUNTER — Telehealth: Payer: Self-pay | Admitting: Family Medicine

## 2015-02-03 NOTE — Telephone Encounter (Signed)
Patient Name: Alexandra Baldwin  DOB: Dec 17, 1934    Initial Comment Caller states she was seen last week for sinus infection. She took antibiotics x 4 days and is now experiencing shortness of breath.    Nurse Assessment  Nurse: Thad Ranger RN, Langley Gauss Date/Time (Eastern Time): 02/03/2015 3:45:10 PM  Confirm and document reason for call. If symptomatic, describe symptoms. ---Caller states she was seen last week for sinus infection. She took antibiotics x 4 days (Augmentin and finished Prednisone today). She is now experiencing shortness of breath when she gets up in the am and with min exercise. Does not use an inhaler or neb.  Has the patient traveled out of the country within the last 30 days? ---Not Applicable  Does the patient have any new or worsening symptoms? ---Yes  Will a triage be completed? ---Yes  Related visit to physician within the last 2 weeks? ---Yes  Does the PT have any chronic conditions? (i.e. diabetes, asthma, etc.) ---No     Guidelines    Guideline Title Affirmed Question Affirmed Notes  Sinus Infection on Antibiotic Follow-up Call [1] Difficulty breathing AND [2] not from stuffy nose (e.g., not relieved by cleaning out the nose)    Final Disposition User   Go to ED Now [place], RN, Langley Gauss    Comments  Pt refused to go to the ER for eval today for SOB. Advised Dr Sarajane Jews has 2 appts left this afternoon, and I can see if he will allow downgrade on triage outcome, but the pt refuses to be seen today in the office as well. States she wants me to transfer her to the office to make an MD appt for the am with Dr Sarajane Jews. Transferred to the MDO number per request.   Referrals  GO TO FACILITY REFUSED   Disagree/Comply: Disagree  Disagree/Comply Reason: Disagree with instructions

## 2015-02-04 ENCOUNTER — Encounter: Payer: Self-pay | Admitting: Family Medicine

## 2015-02-04 ENCOUNTER — Ambulatory Visit (INDEPENDENT_AMBULATORY_CARE_PROVIDER_SITE_OTHER): Payer: PPO | Admitting: Family Medicine

## 2015-02-04 ENCOUNTER — Ambulatory Visit (INDEPENDENT_AMBULATORY_CARE_PROVIDER_SITE_OTHER)
Admission: RE | Admit: 2015-02-04 | Discharge: 2015-02-04 | Disposition: A | Discharge status: Home / Self Care | Payer: PPO | Source: Ambulatory Visit | Attending: Family Medicine | Admitting: Family Medicine

## 2015-02-04 VITALS — BP 126/60 | HR 84 | Ht 60.75 in | Wt 130.0 lb

## 2015-02-04 DIAGNOSIS — R0602 Shortness of breath: Secondary | ICD-10-CM | POA: Diagnosis not present

## 2015-02-04 DIAGNOSIS — J019 Acute sinusitis, unspecified: Secondary | ICD-10-CM

## 2015-02-04 DIAGNOSIS — J4521 Mild intermittent asthma with (acute) exacerbation: Secondary | ICD-10-CM | POA: Diagnosis not present

## 2015-02-04 DIAGNOSIS — J45901 Unspecified asthma with (acute) exacerbation: Secondary | ICD-10-CM | POA: Insufficient documentation

## 2015-02-04 MED ORDER — CLARITHROMYCIN 500 MG PO TABS: 500.0000 mg | ORAL_TABLET | Freq: Two times a day (BID) | ORAL | Status: DC

## 2015-02-04 MED ORDER — ALBUTEROL SULFATE HFA 108 (90 BASE) MCG/ACT IN AERS: 2.0000 | INHALATION_SPRAY | RESPIRATORY_TRACT | Status: DC | PRN

## 2015-02-04 MED ORDER — MONTELUKAST SODIUM 10 MG PO TABS: ORAL_TABLET | ORAL | Status: DC

## 2015-02-04 NOTE — Progress Notes (Signed)
Pre visit review using our clinic review tool, if applicable. No additional management support is needed unless otherwise documented below in the visit note. 

## 2015-02-04 NOTE — Progress Notes (Signed)
   Subjective:    Patient ID: Alexandra Baldwin, female    DOB: 05/31/1934, 80 y.o.   MRN: [id number]  HPI Here to follow up on a sinusitis which we saw her for on 01-28-15. She has finished a Medrol dose pack and is taking Augmentin, but she is not improving. She still has a cough but now she also gets SOB at times. No wheezing or chest pain. She notes a hx of asthma that has been absent for a few years.    Review of Systems  Constitutional: Negative.   HENT: Positive for congestion, postnasal drip and sinus pressure. Negative for ear pain and sore throat.   Eyes: Negative.   Respiratory: Positive for cough, chest tightness and shortness of breath. Negative for wheezing.   Cardiovascular: Negative.        Objective:   Physical Exam  Constitutional: She appears well-developed and well-nourished.  HENT:  Right Ear: External ear normal.  Left Ear: External ear normal.  Nose: Nose normal.  Mouth/Throat: Oropharynx is clear and moist.  Eyes: Conjunctivae are normal.  Neck: No thyromegaly present.  Cardiovascular: Normal rate, regular rhythm, normal heart sounds and intact distal pulses.   Pulmonary/Chest: Effort normal and breath sounds normal. No respiratory distress. She has no wheezes. She has no rales.  Musculoskeletal: She exhibits no edema.  Lymphadenopathy:    She has no cervical adenopathy.          Assessment & Plan:  Partially treated sinusitis nthat has caused an exacerbation of her asthma. Stop Augmentin and switch to Biaxin 500 mg bid for 10 days. Get back on Singulair daily and she can use an albuterol inhaler prn. Send her for a CXR today.

## 2015-02-26 ENCOUNTER — Telehealth: Payer: Self-pay | Admitting: Family Medicine

## 2015-02-26 NOTE — Telephone Encounter (Signed)
Pt needs a spacer for her inhaler. Call into [facility] [address]

## 2015-03-03 MED ORDER — SPACER/AERO-HOLDING CHAMBERS DEVI: Status: DC

## 2015-03-03 NOTE — Telephone Encounter (Signed)
I sent script e-scribe and tried to reach pt, mail box was full.

## 2015-03-03 NOTE — Telephone Encounter (Signed)
Patient called again about the spacer for her inhaler and now she would like for Dr. Sarajane Jews to give her a call about it. Cell  [phone]

## 2015-03-14 ENCOUNTER — Other Ambulatory Visit: Payer: Self-pay | Admitting: Family Medicine

## 2015-05-09 ENCOUNTER — Telehealth: Payer: Self-pay | Admitting: Family Medicine

## 2015-05-09 NOTE — Telephone Encounter (Signed)
Pt states she has hx of "sciatica".   Onset last week of sharp pain radiating from her left lower lumbar down to ankle at times. Pain is somewhat intermittent. No numbness. No weakness. She's taken Aleve which is helped. has previously taken tramadol and was requesting refill. She has not had any loss of urine or stool control. Pain is mild-to-moderate. Sleeping okay. Given increasing relief to one twice daily and supplement with Tylenol. Follow-up with primary by Monday if not improving

## 2015-05-11 ENCOUNTER — Telehealth: Payer: Self-pay | Admitting: Family Medicine

## 2015-05-11 ENCOUNTER — Ambulatory Visit (INDEPENDENT_AMBULATORY_CARE_PROVIDER_SITE_OTHER): Payer: PPO | Admitting: Family Medicine

## 2015-05-11 ENCOUNTER — Encounter: Payer: Self-pay | Admitting: Family Medicine

## 2015-05-11 VITALS — BP 137/65 | HR 86 | Temp 98.8°F | Ht 60.75 in | Wt 132.0 lb

## 2015-05-11 DIAGNOSIS — M5432 Sciatica, left side: Secondary | ICD-10-CM | POA: Diagnosis not present

## 2015-05-11 MED ORDER — DICLOFENAC SODIUM 75 MG PO TBEC: 75.0000 mg | DELAYED_RELEASE_TABLET | Freq: Two times a day (BID) | ORAL | Status: DC | PRN

## 2015-05-11 MED ORDER — TRAMADOL HCL 50 MG PO TABS: ORAL_TABLET | ORAL | Status: DC

## 2015-05-11 NOTE — Addendum Note (Signed)
Addended by: Aggie Hacker A on: 05/11/2015 04:45 PM   Modules accepted: Orders

## 2015-05-11 NOTE — Progress Notes (Signed)
   Subjective:    Patient ID: Alexandra Baldwin, female    DOB: 1934-12-09, 81 y.o.   MRN: [id number]  HPI Here for one month of low back pain which radiates down the left leg. No recent trauma but she has been very busy cleaning her house and carrying out trash. She is taking Aleve and using heat with mixed results.    Review of Systems  Constitutional: Negative.   Gastrointestinal: Negative.   Genitourinary: Negative.   Musculoskeletal: Positive for back pain.       Objective:   Physical Exam  Constitutional: She appears well-developed and well-nourished. No distress.  Musculoskeletal:  Tender in the left lower back and over the left sciatic notch, ROM is full           Assessment & Plan:  Sciatica, try Diclofenac bid and add Tramadol prn

## 2015-05-11 NOTE — Telephone Encounter (Signed)
[facility] Night - Client [facility] Patient Name: Alexandra Baldwin Gender: Female DOB: 01-10-1935 Age: 81 Y 31 M 19 D Return Phone Number: [phone] (Primary) Address: City/State/Zip: [place] Client [facility] Night - Client Client Site [facility] - Night Physician [name] Contact Type Call Call Type Triage / Clinical Relationship To Patient Self Return Phone Number [phone] (Primary) Chief Complaint Hip pain Initial Comment Caller says she has pain from her hip to her leg and wants Tremadol called in. It is sciatica PreDisposition Home Care Nurse Assessment Nurse: [place] Desanctis, RN, Amy Date/Time Eilene Ghazi Time): 05/09/2015 10:24:30 AM Confirm and document reason for call. If symptomatic, describe symptoms. You must click the next button to save text entered. ---Caller states she is having pain in her leg and hip from sciatica. Has taken tramadol for it in the past. Has the patient traveled out of the country within the last 30 days? ---No Does the patient have any new or worsening symptoms? ---Yes Will a triage be completed? ---Yes Related visit to physician within the last 2 weeks? ---No Does the PT have any chronic conditions? (i.e. diabetes, asthma, etc.) ---No Is this a behavioral health or substance abuse call? ---No Guidelines Guideline Title Affirmed Question Affirmed Notes Nurse Date/Time Eilene Ghazi Time) Leg Pain [1] MODERATE pain (e.g., interferes with normal activities, limping) AND [2] present > 3 days [place] Desanctis, RN, Amy 05/09/2015 10:27:19 AM Disp. Time Eilene Ghazi Time) Disposition Final User 05/09/2015 10:35:16 AM Paged On Call back to [facility], [place], Amy 05/09/2015 10:36:01 AM Paged On Call back to [facility], [place], Amy Reason: Paged Dr Jarold Song back to call center. 05/09/2015 10:57:25 AM Called On-Call Provider [place] Desanctis, RN, Amy 05/09/2015  10:32:38 AM See PCP When Office is Open (within 3 days) Yes [place],

## 2015-05-11 NOTE — Telephone Encounter (Signed)
Pt is on schedule for today 05/11/2015 to see Dr. Sarajane Jews.

## 2015-05-11 NOTE — Telephone Encounter (Signed)
Per Dr. Sarajane Jews order Diclofenac 75 mg take bid prn and script was sent e-scribe, also spoke with pt.

## 2015-05-11 NOTE — Progress Notes (Signed)
Pre visit review using our clinic review tool, if applicable. No additional management support is needed unless otherwise documented below in the visit note. 

## 2015-05-15 ENCOUNTER — Telehealth: Payer: Self-pay | Admitting: Family Medicine

## 2015-05-15 DIAGNOSIS — M5442 Lumbago with sciatica, left side: Secondary | ICD-10-CM

## 2015-05-15 NOTE — Telephone Encounter (Signed)
I spoke with pt  

## 2015-05-15 NOTE — Telephone Encounter (Signed)
Pt state that she promised Dr Sarajane Jews that she would give him a call yesterday (thursday) but she decided to wait until today. Pt is currently taking Voltaren she would like to know if Dr Sarajane Jews wants her to do the Xray now or wait until she finish her course of Voltaren. Also pt would like to speak directly with Dr Sarajane Jews

## 2015-05-15 NOTE — Telephone Encounter (Signed)
Tell her I did put in orders for a lumbar spine MRI which I hope she can have done next week

## 2015-05-15 NOTE — Telephone Encounter (Signed)
Pt was seen for hip pain on 05-11-15 and pt went to work 2-2 and started back  having pains. Please advise. Please would like dr fry to return her call

## 2015-05-19 DIAGNOSIS — M9902 Segmental and somatic dysfunction of thoracic region: Secondary | ICD-10-CM | POA: Diagnosis not present

## 2015-05-19 DIAGNOSIS — M5432 Sciatica, left side: Secondary | ICD-10-CM | POA: Diagnosis not present

## 2015-05-19 DIAGNOSIS — M9903 Segmental and somatic dysfunction of lumbar region: Secondary | ICD-10-CM | POA: Diagnosis not present

## 2015-05-19 DIAGNOSIS — M5137 Other intervertebral disc degeneration, lumbosacral region: Secondary | ICD-10-CM | POA: Diagnosis not present

## 2015-05-19 DIAGNOSIS — M5134 Other intervertebral disc degeneration, thoracic region: Secondary | ICD-10-CM | POA: Diagnosis not present

## 2015-05-19 DIAGNOSIS — M9905 Segmental and somatic dysfunction of pelvic region: Secondary | ICD-10-CM | POA: Diagnosis not present

## 2015-05-21 DIAGNOSIS — M5432 Sciatica, left side: Secondary | ICD-10-CM | POA: Diagnosis not present

## 2015-05-21 DIAGNOSIS — M5134 Other intervertebral disc degeneration, thoracic region: Secondary | ICD-10-CM | POA: Diagnosis not present

## 2015-05-21 DIAGNOSIS — M5137 Other intervertebral disc degeneration, lumbosacral region: Secondary | ICD-10-CM | POA: Diagnosis not present

## 2015-05-21 DIAGNOSIS — M9902 Segmental and somatic dysfunction of thoracic region: Secondary | ICD-10-CM | POA: Diagnosis not present

## 2015-05-21 DIAGNOSIS — M9905 Segmental and somatic dysfunction of pelvic region: Secondary | ICD-10-CM | POA: Diagnosis not present

## 2015-05-21 DIAGNOSIS — M9903 Segmental and somatic dysfunction of lumbar region: Secondary | ICD-10-CM | POA: Diagnosis not present

## 2015-05-26 DIAGNOSIS — M9902 Segmental and somatic dysfunction of thoracic region: Secondary | ICD-10-CM | POA: Diagnosis not present

## 2015-05-26 DIAGNOSIS — M9903 Segmental and somatic dysfunction of lumbar region: Secondary | ICD-10-CM | POA: Diagnosis not present

## 2015-05-26 DIAGNOSIS — M5134 Other intervertebral disc degeneration, thoracic region: Secondary | ICD-10-CM | POA: Diagnosis not present

## 2015-05-26 DIAGNOSIS — M5137 Other intervertebral disc degeneration, lumbosacral region: Secondary | ICD-10-CM | POA: Diagnosis not present

## 2015-05-26 DIAGNOSIS — M9905 Segmental and somatic dysfunction of pelvic region: Secondary | ICD-10-CM | POA: Diagnosis not present

## 2015-05-26 DIAGNOSIS — M5432 Sciatica, left side: Secondary | ICD-10-CM | POA: Diagnosis not present

## 2015-05-28 DIAGNOSIS — M5137 Other intervertebral disc degeneration, lumbosacral region: Secondary | ICD-10-CM | POA: Diagnosis not present

## 2015-05-28 DIAGNOSIS — M9902 Segmental and somatic dysfunction of thoracic region: Secondary | ICD-10-CM | POA: Diagnosis not present

## 2015-05-28 DIAGNOSIS — M9903 Segmental and somatic dysfunction of lumbar region: Secondary | ICD-10-CM | POA: Diagnosis not present

## 2015-05-28 DIAGNOSIS — M9905 Segmental and somatic dysfunction of pelvic region: Secondary | ICD-10-CM | POA: Diagnosis not present

## 2015-05-28 DIAGNOSIS — M5134 Other intervertebral disc degeneration, thoracic region: Secondary | ICD-10-CM | POA: Diagnosis not present

## 2015-05-28 DIAGNOSIS — M5432 Sciatica, left side: Secondary | ICD-10-CM | POA: Diagnosis not present

## 2015-06-05 ENCOUNTER — Encounter: Payer: Self-pay | Admitting: Family Medicine

## 2015-06-05 ENCOUNTER — Ambulatory Visit (INDEPENDENT_AMBULATORY_CARE_PROVIDER_SITE_OTHER): Payer: PPO | Admitting: Family Medicine

## 2015-06-05 VITALS — BP 138/66 | HR 80 | Temp 97.6°F | Ht 60.75 in | Wt 133.0 lb

## 2015-06-05 DIAGNOSIS — J019 Acute sinusitis, unspecified: Secondary | ICD-10-CM

## 2015-06-05 MED ORDER — CLARITHROMYCIN 500 MG PO TABS: 500.0000 mg | ORAL_TABLET | Freq: Two times a day (BID) | ORAL | Status: DC

## 2015-06-05 NOTE — Progress Notes (Signed)
   Subjective:    Patient ID: Alexandra Baldwin, female    DOB: 10-Jun-1934, 81 y.o.   MRN: [id number]  HPI Here for one week of sinus pressure and PND. No cough or fever. On Afrin and Sudafed.    Review of Systems  Constitutional: Negative.   HENT: Positive for congestion, postnasal drip and sinus pressure. Negative for ear pain and sore throat.   Eyes: Negative.   Respiratory: Negative.        Objective:   Physical Exam  Constitutional: She appears well-developed and well-nourished.  HENT:  Right Ear: External ear normal.  Left Ear: External ear normal.  Nose: Nose normal.  Mouth/Throat: Oropharynx is clear and moist.  Eyes: Conjunctivae are normal.  Neck: No thyromegaly present.  Pulmonary/Chest: Effort normal and breath sounds normal.  Lymphadenopathy:    She has no cervical adenopathy.          Assessment & Plan:  Sinusitis, treat with Biaxin.

## 2015-06-05 NOTE — Progress Notes (Signed)
Pre visit review using our clinic review tool, if applicable. No additional management support is needed unless otherwise documented below in the visit note. 

## 2015-08-04 DIAGNOSIS — Z780 Asymptomatic menopausal state: Secondary | ICD-10-CM | POA: Diagnosis not present

## 2015-08-04 DIAGNOSIS — N8189 Other female genital prolapse: Secondary | ICD-10-CM | POA: Diagnosis not present

## 2015-08-17 ENCOUNTER — Ambulatory Visit (INDEPENDENT_AMBULATORY_CARE_PROVIDER_SITE_OTHER): Payer: PPO | Admitting: Ophthalmology

## 2015-08-17 DIAGNOSIS — H338 Other retinal detachments: Secondary | ICD-10-CM

## 2015-08-17 DIAGNOSIS — H353131 Nonexudative age-related macular degeneration, bilateral, early dry stage: Secondary | ICD-10-CM | POA: Diagnosis not present

## 2015-08-17 DIAGNOSIS — H43813 Vitreous degeneration, bilateral: Secondary | ICD-10-CM | POA: Diagnosis not present

## 2015-09-18 ENCOUNTER — Encounter: Payer: Self-pay | Admitting: Family Medicine

## 2015-09-18 ENCOUNTER — Ambulatory Visit (INDEPENDENT_AMBULATORY_CARE_PROVIDER_SITE_OTHER): Payer: PPO | Admitting: Family Medicine

## 2015-09-18 VITALS — BP 110/60 | HR 83 | Temp 98.1°F | Ht 60.75 in | Wt 129.4 lb

## 2015-09-18 DIAGNOSIS — J4521 Mild intermittent asthma with (acute) exacerbation: Secondary | ICD-10-CM | POA: Diagnosis not present

## 2015-09-18 DIAGNOSIS — R0602 Shortness of breath: Secondary | ICD-10-CM

## 2015-09-18 LAB — CBC WITH DIFFERENTIAL/PLATELET
BASOS PCT: 0.4 % (ref 0.0–3.0)
Basophils Absolute: 0 10*3/uL (ref 0.0–0.1)
EOS ABS: 0.1 10*3/uL (ref 0.0–0.7)
EOS PCT: 2 % (ref 0.0–5.0)
HEMATOCRIT: 38.4 % (ref 36.0–46.0)
Hemoglobin: 12.8 g/dL (ref 12.0–15.0)
LYMPHS PCT: 11 % — AB (ref 12.0–46.0)
Lymphs Abs: 0.8 10*3/uL (ref 0.7–4.0)
MCHC: 33.4 g/dL (ref 30.0–36.0)
MCV: 96.1 fl (ref 78.0–100.0)
MONO ABS: 0.8 10*3/uL (ref 0.1–1.0)
Monocytes Relative: 10.6 % (ref 3.0–12.0)
NEUTROS ABS: 5.4 10*3/uL (ref 1.4–7.7)
Neutrophils Relative %: 76 % (ref 43.0–77.0)
PLATELETS: 382 10*3/uL (ref 150.0–400.0)
RBC: 4 Mil/uL (ref 3.87–5.11)
RDW: 13.5 % (ref 11.5–15.5)
WBC: 7.1 10*3/uL (ref 4.0–10.5)

## 2015-09-18 LAB — BASIC METABOLIC PANEL
BUN: 20 mg/dL (ref 6–23)
CHLORIDE: 101 meq/L (ref 96–112)
CO2: 30 meq/L (ref 19–32)
Calcium: 9.3 mg/dL (ref 8.4–10.5)
Creatinine, Ser: 0.87 mg/dL (ref 0.40–1.20)
GFR: 66.8 mL/min (ref >60.00)
GLUCOSE: 91 mg/dL (ref 70–99)
POTASSIUM: 4.6 meq/L (ref 3.5–5.1)
SODIUM: 138 meq/L (ref 135–145)

## 2015-09-18 LAB — HEPATIC FUNCTION PANEL
ALBUMIN: 4.2 g/dL (ref 3.5–5.2)
ALK PHOS: 60 U/L (ref 39–117)
ALT: 29 U/L (ref 0–35)
AST: 26 U/L (ref 0–37)
Bilirubin, Direct: 0.1 mg/dL (ref 0.0–0.3)
TOTAL PROTEIN: 6.8 g/dL (ref 6.0–8.3)
Total Bilirubin: 0.4 mg/dL (ref 0.2–1.2)

## 2015-09-18 LAB — POC URINALSYSI DIPSTICK (AUTOMATED)
BILIRUBIN UA: NEGATIVE
Glucose, UA: NEGATIVE
KETONES UA: NEGATIVE
Leukocytes, UA: NEGATIVE
Nitrite, UA: NEGATIVE
PH UA: 7
Protein, UA: NEGATIVE
Spec Grav, UA: 1.01
Urobilinogen, UA: 0.2

## 2015-09-18 LAB — LIPID PANEL
CHOLESTEROL: 309 mg/dL — AB (ref 0–200)
HDL: 65.6 mg/dL (ref >39.00)
LDL Cholesterol: 214 mg/dL — ABNORMAL HIGH (ref 0–99)
NonHDL: 243.56
Total CHOL/HDL Ratio: 5
Triglycerides: 149 mg/dL (ref 0.0–149.0)
VLDL: 29.8 mg/dL (ref 0.0–40.0)

## 2015-09-18 LAB — TSH: TSH: 1.8 u[IU]/mL (ref 0.35–4.50)

## 2015-09-18 NOTE — Progress Notes (Signed)
   Subjective:    Patient ID: Alexandra Baldwin, female    DOB: 22-Feb-1935, 81 y.o.   MRN: [id number]  HPI Here for 3 weeks of intermittent symptoms that are not severe, but they are of concern to her. She has felt more SOB than usual, and she gets out of breath just by going up one flight of steps. No coughing or wheezing. No chest pains but she has had some intemittent back pains. No sweats or nausea. Using her inhaler about once a day.    Review of Systems  Constitutional: Negative.   HENT: Negative.   Eyes: Negative.   Respiratory: Positive for shortness of breath. Negative for cough, choking and wheezing.   Cardiovascular: Negative.   Endocrine: Negative.   Neurological: Negative.        Objective:   Physical Exam  Constitutional: She is oriented to person, place, and time. She appears well-developed and well-nourished. No distress.  Neck: No thyromegaly present.  Cardiovascular: Normal rate, regular rhythm, normal heart sounds and intact distal pulses.   No murmur heard. Occasional ectopy. EKG shows sinus rhythm wit frequent PACs   Pulmonary/Chest: Effort normal and breath sounds normal. No respiratory distress. She has no wheezes. She has no rales.  Musculoskeletal: She exhibits no edema.  Lymphadenopathy:    She has no cervical adenopathy.  Neurological: She is alert and oriented to person, place, and time.          Assessment & Plan:  Her SOB may be related to allergies but we need to make sure this is not a sign of cardiac problems. Get labs today. We will set her up for a stress test soon.  Laurey Morale, MD

## 2015-09-18 NOTE — Progress Notes (Signed)
Pre visit review using our clinic review tool, if applicable. No additional management support is needed unless otherwise documented below in the visit note. 

## 2015-09-21 ENCOUNTER — Telehealth (HOSPITAL_COMMUNITY): Payer: Self-pay | Admitting: *Deleted

## 2015-09-21 NOTE — Telephone Encounter (Signed)
Left message on voicemail in reference to upcoming appointment scheduled for 09/24/15. Phone number given for a call back so details instructions can be given. Hubbard Robinson, RN

## 2015-09-23 ENCOUNTER — Ambulatory Visit (HOSPITAL_COMMUNITY): Payer: PPO

## 2015-09-24 ENCOUNTER — Ambulatory Visit (HOSPITAL_COMMUNITY): Payer: PPO

## 2015-10-02 ENCOUNTER — Encounter (HOSPITAL_COMMUNITY): Payer: PPO

## 2015-10-26 ENCOUNTER — Encounter (HOSPITAL_COMMUNITY): Payer: PPO

## 2016-01-04 DIAGNOSIS — N8182 Incompetence or weakening of pubocervical tissue: Secondary | ICD-10-CM | POA: Diagnosis not present

## 2016-01-05 DIAGNOSIS — N8189 Other female genital prolapse: Secondary | ICD-10-CM | POA: Diagnosis not present

## 2016-01-11 DIAGNOSIS — N8182 Incompetence or weakening of pubocervical tissue: Secondary | ICD-10-CM | POA: Diagnosis not present

## 2016-02-02 DIAGNOSIS — M9903 Segmental and somatic dysfunction of lumbar region: Secondary | ICD-10-CM | POA: Diagnosis not present

## 2016-02-02 DIAGNOSIS — M5431 Sciatica, right side: Secondary | ICD-10-CM | POA: Diagnosis not present

## 2016-02-02 DIAGNOSIS — M47816 Spondylosis without myelopathy or radiculopathy, lumbar region: Secondary | ICD-10-CM | POA: Diagnosis not present

## 2016-02-02 DIAGNOSIS — M5136 Other intervertebral disc degeneration, lumbar region: Secondary | ICD-10-CM | POA: Diagnosis not present

## 2016-02-08 DIAGNOSIS — N8182 Incompetence or weakening of pubocervical tissue: Secondary | ICD-10-CM | POA: Diagnosis not present

## 2016-02-16 DIAGNOSIS — M9903 Segmental and somatic dysfunction of lumbar region: Secondary | ICD-10-CM | POA: Diagnosis not present

## 2016-02-16 DIAGNOSIS — M5136 Other intervertebral disc degeneration, lumbar region: Secondary | ICD-10-CM | POA: Diagnosis not present

## 2016-02-16 DIAGNOSIS — M47816 Spondylosis without myelopathy or radiculopathy, lumbar region: Secondary | ICD-10-CM | POA: Diagnosis not present

## 2016-02-16 DIAGNOSIS — M5431 Sciatica, right side: Secondary | ICD-10-CM | POA: Diagnosis not present

## 2016-02-19 ENCOUNTER — Other Ambulatory Visit: Payer: Self-pay | Admitting: Family Medicine

## 2016-03-22 ENCOUNTER — Telehealth: Payer: Self-pay | Admitting: Family Medicine

## 2016-03-22 DIAGNOSIS — E782 Mixed hyperlipidemia: Secondary | ICD-10-CM

## 2016-03-22 NOTE — Telephone Encounter (Signed)
Pt states last visit elevated lipids, would like an order to have done prior to a follow up appt instead all all same day. OK to schedule?

## 2016-03-23 ENCOUNTER — Other Ambulatory Visit: Payer: Self-pay | Admitting: Family Medicine

## 2016-03-23 NOTE — Telephone Encounter (Signed)
I put in the order so she can just make a lab appt

## 2016-03-24 ENCOUNTER — Ambulatory Visit: Payer: PPO | Admitting: Family Medicine

## 2016-03-25 NOTE — Telephone Encounter (Signed)
Left message to call back  

## 2016-03-28 DIAGNOSIS — N8182 Incompetence or weakening of pubocervical tissue: Secondary | ICD-10-CM | POA: Diagnosis not present

## 2016-03-31 ENCOUNTER — Other Ambulatory Visit: Payer: Self-pay | Admitting: Family Medicine

## 2016-03-31 NOTE — Telephone Encounter (Signed)
I do not see this medication on patient's current medication list. Is this ok to refill?

## 2016-04-05 ENCOUNTER — Telehealth (HOSPITAL_COMMUNITY): Payer: Self-pay | Admitting: *Deleted

## 2016-04-05 NOTE — Telephone Encounter (Signed)
Patient called to review  Instructions. Patient stated she spoke with someone here on Saturday about cancelling appointment. She stated her authorization was only through 03/21/16. No note was made in reference of this. I cancelled her appointment today for Thursday. Patient stated once she get it authorized with a different date, she will call back to R/S

## 2016-04-07 ENCOUNTER — Encounter (HOSPITAL_COMMUNITY): Payer: PPO

## 2016-05-22 ENCOUNTER — Other Ambulatory Visit: Payer: Self-pay | Admitting: Family Medicine

## 2016-05-23 ENCOUNTER — Encounter: Payer: Self-pay | Admitting: Family Medicine

## 2016-05-23 ENCOUNTER — Ambulatory Visit (INDEPENDENT_AMBULATORY_CARE_PROVIDER_SITE_OTHER): Payer: PPO | Admitting: Family Medicine

## 2016-05-23 VITALS — BP 134/71 | HR 84 | Temp 98.4°F | Ht 60.75 in | Wt 126.0 lb

## 2016-05-23 DIAGNOSIS — R209 Unspecified disturbances of skin sensation: Secondary | ICD-10-CM | POA: Diagnosis not present

## 2016-05-23 DIAGNOSIS — E782 Mixed hyperlipidemia: Secondary | ICD-10-CM

## 2016-05-23 DIAGNOSIS — G5603 Carpal tunnel syndrome, bilateral upper limbs: Secondary | ICD-10-CM | POA: Diagnosis not present

## 2016-05-23 MED ORDER — SUMATRIPTAN SUCCINATE 100 MG PO TABS: 100.0000 mg | ORAL_TABLET | ORAL | 5 refills | Status: DC | PRN

## 2016-05-23 NOTE — Progress Notes (Signed)
   Subjective:    Patient ID: Alexandra Baldwin, female    DOB: 03/31/1935, 82 y.o.   MRN: [id number]  HPI Here for worsening symptoms in both hands of intermittent numbness, tingling, and pain. No weakness. No neck problems. No symptoms like this in the feet. This started about a year ago but is getting worse.    Review of Systems  Constitutional: Negative.   Respiratory: Negative.   Cardiovascular: Negative.   Neurological: Positive for numbness. Negative for weakness.       Objective:   Physical Exam  Constitutional: She is oriented to person, place, and time. She appears well-developed and well-nourished.  Cardiovascular: Normal rate, regular rhythm, normal heart sounds and intact distal pulses.   Pulmonary/Chest: Effort normal and breath sounds normal.  Musculoskeletal:  The wrists and hands appear normal, no tenderness. Tinels sign is positive bilaterally  Neurological: She is alert and oriented to person, place, and time.          Assessment & Plan:  Carpal tunnel syndrome. She will wear wrist splints as much as possible. We will refer to Hand Surgery to evaluate. [name], MD

## 2016-05-23 NOTE — Progress Notes (Signed)
Pre visit review using our clinic review tool, if applicable. No additional management support is needed unless otherwise documented below in the visit note. 

## 2016-05-24 ENCOUNTER — Other Ambulatory Visit: Payer: PPO

## 2016-05-25 ENCOUNTER — Telehealth: Payer: Self-pay | Admitting: Family Medicine

## 2016-05-25 NOTE — Telephone Encounter (Signed)
I tried to reach pt, no answer.  

## 2016-05-25 NOTE — Telephone Encounter (Signed)
°  Pt would like you to call them concerning Lab results

## 2016-05-26 DIAGNOSIS — J328 Other chronic sinusitis: Secondary | ICD-10-CM | POA: Diagnosis not present

## 2016-05-26 NOTE — Telephone Encounter (Signed)
I spoke with pt and she had to reschedule lab appointment.

## 2016-06-13 ENCOUNTER — Other Ambulatory Visit: Payer: PPO

## 2016-06-13 ENCOUNTER — Other Ambulatory Visit (INDEPENDENT_AMBULATORY_CARE_PROVIDER_SITE_OTHER): Payer: PPO

## 2016-06-13 DIAGNOSIS — E782 Mixed hyperlipidemia: Secondary | ICD-10-CM

## 2016-06-13 DIAGNOSIS — R209 Unspecified disturbances of skin sensation: Secondary | ICD-10-CM

## 2016-06-13 LAB — CBC WITH DIFFERENTIAL/PLATELET
BASOS ABS: 0 10*3/uL (ref 0.0–0.1)
BASOS PCT: 0.6 % (ref 0.0–3.0)
EOS PCT: 2.4 % (ref 0.0–5.0)
Eosinophils Absolute: 0.2 10*3/uL (ref 0.0–0.7)
HEMATOCRIT: 36.9 % (ref 36.0–46.0)
Hemoglobin: 12.4 g/dL (ref 12.0–15.0)
LYMPHS PCT: 17.2 % (ref 12.0–46.0)
Lymphs Abs: 1.2 10*3/uL (ref 0.7–4.0)
MCHC: 33.5 g/dL (ref 30.0–36.0)
MCV: 96.3 fl (ref 78.0–100.0)
MONOS PCT: 11.1 % (ref 3.0–12.0)
Monocytes Absolute: 0.8 10*3/uL (ref 0.1–1.0)
NEUTROS ABS: 4.7 10*3/uL (ref 1.4–7.7)
Neutrophils Relative %: 68.7 % (ref 43.0–77.0)
PLATELETS: 352 10*3/uL (ref 150.0–400.0)
RBC: 3.83 Mil/uL — ABNORMAL LOW (ref 3.87–5.11)
RDW: 13.6 % (ref 11.5–15.5)
WBC: 6.9 10*3/uL (ref 4.0–10.5)

## 2016-06-13 LAB — HEPATIC FUNCTION PANEL
ALK PHOS: 53 U/L (ref 39–117)
ALT: 37 U/L — ABNORMAL HIGH (ref 0–35)
AST: 30 U/L (ref 0–37)
Albumin: 3.8 g/dL (ref 3.5–5.2)
BILIRUBIN DIRECT: 0.1 mg/dL (ref 0.0–0.3)
Total Bilirubin: 0.4 mg/dL (ref 0.2–1.2)
Total Protein: 6.2 g/dL (ref 6.0–8.3)

## 2016-06-13 LAB — BASIC METABOLIC PANEL
BUN: 15 mg/dL (ref 6–23)
CALCIUM: 9 mg/dL (ref 8.4–10.5)
CO2: 30 mEq/L (ref 19–32)
Chloride: 104 mEq/L (ref 96–112)
Creatinine, Ser: 0.8 mg/dL (ref 0.40–1.20)
GFR: 73.45 mL/min (ref >60.00)
Glucose, Bld: 83 mg/dL (ref 70–99)
POTASSIUM: 4.1 meq/L (ref 3.5–5.1)
SODIUM: 142 meq/L (ref 135–145)

## 2016-06-13 LAB — LIPID PANEL
CHOLESTEROL: 270 mg/dL — AB (ref 0–200)
HDL: 71.3 mg/dL (ref >39.00)
LDL CALC: 173 mg/dL — AB (ref 0–99)
NonHDL: 198.32
TRIGLYCERIDES: 126 mg/dL (ref 0.0–149.0)
Total CHOL/HDL Ratio: 4
VLDL: 25.2 mg/dL (ref 0.0–40.0)

## 2016-06-13 LAB — TSH: TSH: 2.55 u[IU]/mL (ref 0.35–4.50)

## 2016-06-13 LAB — VITAMIN B12: Vitamin B-12: 478 pg/mL (ref 211–911)

## 2016-06-27 NOTE — Telephone Encounter (Signed)
done

## 2016-06-30 ENCOUNTER — Telehealth (HOSPITAL_COMMUNITY): Payer: Self-pay | Admitting: *Deleted

## 2016-06-30 NOTE — Telephone Encounter (Signed)
Left message on voicemail in reference to upcoming appointment scheduled for 07/04/16. Phone number given for a call back so details instructions can be given. Kirstie Peri

## 2016-07-01 ENCOUNTER — Telehealth (HOSPITAL_COMMUNITY): Payer: Self-pay

## 2016-07-01 NOTE — Telephone Encounter (Signed)
Patient given detailed instructions per Myocardial Perfusion Study Information Sheet for the test on 07/04/2016 at 10:00. Patient notified to arrive 15 minutes early and that it is imperative to arrive on time for appointment to keep from having the test rescheduled.  If you need to cancel or reschedule your appointment, please call the office within 24 hours of your appointment. Failure to do so may result in a cancellation of your appointment, and a $50 no show fee. Patient verbalized understanding.EHK

## 2016-07-04 ENCOUNTER — Ambulatory Visit (HOSPITAL_COMMUNITY): Payer: PPO | Attending: Cardiology

## 2016-07-04 DIAGNOSIS — R079 Chest pain, unspecified: Secondary | ICD-10-CM | POA: Insufficient documentation

## 2016-07-04 DIAGNOSIS — R000 Tachycardia, unspecified: Secondary | ICD-10-CM | POA: Diagnosis not present

## 2016-07-04 DIAGNOSIS — R002 Palpitations: Secondary | ICD-10-CM | POA: Insufficient documentation

## 2016-07-04 DIAGNOSIS — R0602 Shortness of breath: Secondary | ICD-10-CM

## 2016-07-04 LAB — MYOCARDIAL PERFUSION IMAGING
LVDIAVOL: 65 mL (ref 46–106)
LVSYSVOL: 19 mL
RATE: 0.3
SDS: 6
SRS: 5
SSS: 11
TID: 0.79

## 2016-07-04 MED ORDER — TECHNETIUM TC 99M TETROFOSMIN IV KIT
32.1000 | PACK | Freq: Once | INTRAVENOUS | Status: AC | PRN
  Administered 2016-07-04: 32.1 via INTRAVENOUS
  Filled 2016-07-04: qty 33

## 2016-07-04 MED ORDER — TECHNETIUM TC 99M TETROFOSMIN IV KIT
10.8000 | PACK | Freq: Once | INTRAVENOUS | Status: AC | PRN
  Administered 2016-07-04: 10.8 via INTRAVENOUS
  Filled 2016-07-04: qty 11

## 2016-07-11 DIAGNOSIS — M79641 Pain in right hand: Secondary | ICD-10-CM | POA: Diagnosis not present

## 2016-07-11 DIAGNOSIS — G5602 Carpal tunnel syndrome, left upper limb: Secondary | ICD-10-CM | POA: Diagnosis not present

## 2016-07-11 DIAGNOSIS — G5601 Carpal tunnel syndrome, right upper limb: Secondary | ICD-10-CM | POA: Diagnosis not present

## 2016-07-11 DIAGNOSIS — M79642 Pain in left hand: Secondary | ICD-10-CM | POA: Diagnosis not present

## 2016-07-16 ENCOUNTER — Other Ambulatory Visit: Payer: Self-pay | Admitting: Family Medicine

## 2016-08-24 DIAGNOSIS — H903 Sensorineural hearing loss, bilateral: Secondary | ICD-10-CM | POA: Diagnosis not present

## 2016-08-24 DIAGNOSIS — J328 Other chronic sinusitis: Secondary | ICD-10-CM | POA: Diagnosis not present

## 2016-08-24 DIAGNOSIS — T7840XD Allergy, unspecified, subsequent encounter: Secondary | ICD-10-CM | POA: Diagnosis not present

## 2016-08-24 DIAGNOSIS — J309 Allergic rhinitis, unspecified: Secondary | ICD-10-CM | POA: Diagnosis not present

## 2016-09-14 DIAGNOSIS — J328 Other chronic sinusitis: Secondary | ICD-10-CM | POA: Diagnosis not present

## 2016-10-16 ENCOUNTER — Other Ambulatory Visit: Payer: Self-pay | Admitting: Family Medicine

## 2016-10-18 DIAGNOSIS — N3281 Overactive bladder: Secondary | ICD-10-CM | POA: Diagnosis not present

## 2016-10-18 DIAGNOSIS — Z1231 Encounter for screening mammogram for malignant neoplasm of breast: Secondary | ICD-10-CM | POA: Diagnosis not present

## 2016-10-18 DIAGNOSIS — Z01419 Encounter for gynecological examination (general) (routine) without abnormal findings: Secondary | ICD-10-CM | POA: Diagnosis not present

## 2016-10-18 DIAGNOSIS — Z6824 Body mass index (BMI) 24.0-24.9, adult: Secondary | ICD-10-CM | POA: Diagnosis not present

## 2016-11-02 DIAGNOSIS — M5442 Lumbago with sciatica, left side: Secondary | ICD-10-CM | POA: Diagnosis not present

## 2017-01-05 ENCOUNTER — Ambulatory Visit: Payer: PPO

## 2017-01-23 DIAGNOSIS — H353131 Nonexudative age-related macular degeneration, bilateral, early dry stage: Secondary | ICD-10-CM | POA: Diagnosis not present

## 2017-02-03 ENCOUNTER — Encounter: Payer: Self-pay | Admitting: Family Medicine

## 2017-02-03 ENCOUNTER — Ambulatory Visit (INDEPENDENT_AMBULATORY_CARE_PROVIDER_SITE_OTHER): Payer: PPO | Admitting: Family Medicine

## 2017-02-03 VITALS — BP 96/58 | Temp 98.4°F | Ht 61.0 in | Wt 124.0 lb

## 2017-02-03 DIAGNOSIS — E782 Mixed hyperlipidemia: Secondary | ICD-10-CM | POA: Diagnosis not present

## 2017-02-03 LAB — LIPID PANEL
Cholesterol: 271 mg/dL — ABNORMAL HIGH (ref 0–200)
HDL: 73.6 mg/dL (ref >39.00)
LDL Cholesterol: 174 mg/dL — ABNORMAL HIGH (ref 0–99)
NONHDL: 197.08
Total CHOL/HDL Ratio: 4
Triglycerides: 114 mg/dL (ref 0.0–149.0)
VLDL: 22.8 mg/dL (ref 0.0–40.0)

## 2017-02-03 NOTE — Patient Instructions (Signed)
WE NOW OFFER   Alexandra Baldwin's FAST TRACK!!!  SAME DAY Appointments for ACUTE CARE  Such as: Sprains, Injuries, cuts, abrasions, rashes, muscle pain, joint pain, back pain Colds, flu, sore throats, headache, allergies, cough, fever  Ear pain, sinus and eye infections Abdominal pain, nausea, vomiting, diarrhea, upset stomach Animal/insect bites  3 Easy Ways to Schedule: Walk-In Scheduling Call in scheduling Mychart Sign-up: [email or web address]         

## 2017-02-03 NOTE — Progress Notes (Signed)
   Subjective:    Patient ID: Alexandra Baldwin, female    DOB: [birth year], 82 y.o.   MRN: [id number]  HPI Here to follow up on high cholesterol. She has been watching a strict diet and is fasting this morning. She feels well. She has had a flu shot and her first Shingrix vaccine.    Review of Systems  Constitutional: Negative.   Respiratory: Negative.   Cardiovascular: Negative.   Neurological: Negative.        Objective:   Physical Exam  Constitutional: She is oriented to person, place, and time. She appears well-developed and well-nourished.  Cardiovascular: Normal rate, regular rhythm, normal heart sounds and intact distal pulses.   Pulmonary/Chest: Effort normal and breath sounds normal. No respiratory distress. She has no wheezes. She has no rales.  Neurological: She is alert and oriented to person, place, and time.          Assessment & Plan:  Hyperlipidemia. Get a lipid panel today. Alexandra Penna, MD

## 2017-02-08 ENCOUNTER — Telehealth: Payer: Self-pay | Admitting: Family Medicine

## 2017-02-08 NOTE — Telephone Encounter (Signed)
I spoke with pt and went over recent lab results, also printed a copy for pt to pick up.

## 2017-02-08 NOTE — Telephone Encounter (Signed)
Pt would like if you will print the lab results and she will pick up. Would like a call back later as well

## 2017-08-08 DIAGNOSIS — D1801 Hemangioma of skin and subcutaneous tissue: Secondary | ICD-10-CM | POA: Diagnosis not present

## 2017-08-08 DIAGNOSIS — L821 Other seborrheic keratosis: Secondary | ICD-10-CM | POA: Diagnosis not present

## 2017-08-08 DIAGNOSIS — L570 Actinic keratosis: Secondary | ICD-10-CM | POA: Diagnosis not present

## 2017-10-10 ENCOUNTER — Telehealth: Payer: Self-pay | Admitting: Family Medicine

## 2017-10-10 NOTE — Telephone Encounter (Signed)
Copied from [place] [phone]. Topic: General - Other >> Oct 10, 2017  4:19 PM Cecelia Byars, NT wrote: Reason for CRM: Patient called back and said she would like to have her labs done and she will  not be doing the  AVW ,please call her when the order is placed  she would like her labs scheduled for a morning appointment , please call her  at [phone] ok to leave a message .

## 2017-10-18 ENCOUNTER — Ambulatory Visit (INDEPENDENT_AMBULATORY_CARE_PROVIDER_SITE_OTHER): Payer: PPO | Admitting: Family Medicine

## 2017-10-18 ENCOUNTER — Encounter: Payer: Self-pay | Admitting: Family Medicine

## 2017-10-18 VITALS — BP 108/56 | HR 101 | Temp 97.9°F | Ht 61.0 in | Wt 127.2 lb

## 2017-10-18 DIAGNOSIS — R5383 Other fatigue: Secondary | ICD-10-CM | POA: Diagnosis not present

## 2017-10-18 DIAGNOSIS — E782 Mixed hyperlipidemia: Secondary | ICD-10-CM | POA: Diagnosis not present

## 2017-10-18 LAB — LIPID PANEL
CHOLESTEROL: 283 mg/dL — AB (ref 0–200)
HDL: 68.4 mg/dL (ref >39.00)
LDL CALC: 181 mg/dL — AB (ref 0–99)
NONHDL: 214.19
Total CHOL/HDL Ratio: 4
Triglycerides: 168 mg/dL — ABNORMAL HIGH (ref 0.0–149.0)
VLDL: 33.6 mg/dL (ref 0.0–40.0)

## 2017-10-18 LAB — BASIC METABOLIC PANEL
BUN: 18 mg/dL (ref 6–23)
CO2: 28 mEq/L (ref 19–32)
Calcium: 9.4 mg/dL (ref 8.4–10.5)
Chloride: 104 mEq/L (ref 96–112)
Creatinine, Ser: 0.86 mg/dL (ref 0.40–1.20)
GFR: 67.34 mL/min (ref >60.00)
Glucose, Bld: 94 mg/dL (ref 70–99)
POTASSIUM: 4.5 meq/L (ref 3.5–5.1)
SODIUM: 140 meq/L (ref 135–145)

## 2017-10-18 LAB — CBC WITH DIFFERENTIAL/PLATELET
BASOS ABS: 0 10*3/uL (ref 0.0–0.1)
BASOS PCT: 0.6 % (ref 0.0–3.0)
Eosinophils Absolute: 0.1 10*3/uL (ref 0.0–0.7)
Eosinophils Relative: 1.6 % (ref 0.0–5.0)
HEMATOCRIT: 39.6 % (ref 36.0–46.0)
Hemoglobin: 13.5 g/dL (ref 12.0–15.0)
LYMPHS PCT: 17 % (ref 12.0–46.0)
Lymphs Abs: 1.3 10*3/uL (ref 0.7–4.0)
MCHC: 34.1 g/dL (ref 30.0–36.0)
MCV: 94.3 fl (ref 78.0–100.0)
MONOS PCT: 8.5 % (ref 3.0–12.0)
Monocytes Absolute: 0.6 10*3/uL (ref 0.1–1.0)
NEUTROS ABS: 5.5 10*3/uL (ref 1.4–7.7)
Neutrophils Relative %: 72.3 % (ref 43.0–77.0)
PLATELETS: 377 10*3/uL (ref 150.0–400.0)
RBC: 4.2 Mil/uL (ref 3.87–5.11)
RDW: 13.6 % (ref 11.5–15.5)
WBC: 7.6 10*3/uL (ref 4.0–10.5)

## 2017-10-18 LAB — POC URINALSYSI DIPSTICK (AUTOMATED)
BILIRUBIN UA: NEGATIVE
Glucose, UA: NEGATIVE
Ketones, UA: NEGATIVE
LEUKOCYTES UA: NEGATIVE
NITRITE UA: NEGATIVE
Protein, UA: NEGATIVE
Spec Grav, UA: 1.02 (ref 1.010–1.025)
Urobilinogen, UA: 0.2 E.U./dL
pH, UA: 6 (ref 5.0–8.0)

## 2017-10-18 LAB — HEPATIC FUNCTION PANEL
ALT: 16 U/L (ref 0–35)
AST: 19 U/L (ref 0–37)
Albumin: 4.1 g/dL (ref 3.5–5.2)
Alkaline Phosphatase: 59 U/L (ref 39–117)
BILIRUBIN DIRECT: 0.1 mg/dL (ref 0.0–0.3)
BILIRUBIN TOTAL: 0.4 mg/dL (ref 0.2–1.2)
Total Protein: 6.8 g/dL (ref 6.0–8.3)

## 2017-10-18 LAB — VITAMIN B12: Vitamin B-12: 626 pg/mL (ref 211–911)

## 2017-10-18 LAB — TSH: TSH: 2.01 u[IU]/mL (ref 0.35–4.50)

## 2017-10-18 NOTE — Progress Notes (Signed)
   Subjective:    Patient ID: CHARRISE LARDNER, female    DOB: 1934-11-21, 83 y.o.   MRN: [id number]  HPI Here requesting labs to check her lipids and also to look for causes of fatigue. She realizes that she works very long hours for someone her age. She sleeps well and eats a healthy diet.    Review of Systems  Constitutional: Positive for fatigue.  Respiratory: Negative.   Cardiovascular: Negative.   Gastrointestinal: Negative.   Genitourinary: Negative.   Neurological: Negative.        Objective:   Physical Exam  Constitutional: She is oriented to person, place, and time. She appears well-developed and well-nourished.  Cardiovascular: Normal rate, regular rhythm, normal heart sounds and intact distal pulses.  Pulmonary/Chest: Effort normal and breath sounds normal. No stridor. No respiratory distress. She has no wheezes. She has no rales.  Abdominal: Soft. Bowel sounds are normal. She exhibits no distension and no mass. There is no tenderness. There is no rebound and no guarding. No hernia.  Neurological: She is alert and oriented to person, place, and time.          Assessment & Plan:  Fatigue, we will send her for labs. Recheck a lipid panel.  [name], MD

## 2017-11-15 ENCOUNTER — Telehealth: Payer: Self-pay

## 2017-11-15 NOTE — Telephone Encounter (Signed)
Lab results have ben printed and sent to be mailed.

## 2017-11-15 NOTE — Telephone Encounter (Signed)
Copied from [place]. Topic: Inquiry >> Nov 15, 2017  9:01 AM Alexandra Baldwin, NT wrote: Reason for CRM: patient is calling and requesting her lab results from 10/18/17 be mailed to her so that she can take them to another doctor appointment.

## 2017-12-04 ENCOUNTER — Telehealth: Payer: Self-pay | Admitting: Family Medicine

## 2017-12-04 NOTE — Telephone Encounter (Signed)
Copied from [place]. Topic: Quick Communication - See Telephone Encounter >> Dec 04, 2017  8:47 AM Synthia Innocent wrote: CRM for notification. See Telephone encounter for: 12/04/17. Patient is requesting to move forward with lipitor. Please advise [facility] at [facility]

## 2017-12-04 NOTE — Telephone Encounter (Signed)
Sent to PCP to advise what does to start pt out on and how many refills.   Thanks   Lab results confirmed elevated cholesterol levels

## 2017-12-05 MED ORDER — ATORVASTATIN CALCIUM 10 MG PO TABS: 10.0000 mg | ORAL_TABLET | Freq: Every day | ORAL | 3 refills | Status: DC

## 2017-12-05 NOTE — Telephone Encounter (Signed)
Rx has been called into pt's pharmacy. Pt advised to follow up in 3 months.

## 2017-12-05 NOTE — Telephone Encounter (Signed)
Call in Lipitor 10 mg to take daily, #90 with 3 rf. Recheck labs in 90 days

## 2017-12-13 DIAGNOSIS — M8588 Other specified disorders of bone density and structure, other site: Secondary | ICD-10-CM | POA: Diagnosis not present

## 2017-12-13 DIAGNOSIS — Z1231 Encounter for screening mammogram for malignant neoplasm of breast: Secondary | ICD-10-CM | POA: Diagnosis not present

## 2017-12-13 DIAGNOSIS — Z01419 Encounter for gynecological examination (general) (routine) without abnormal findings: Secondary | ICD-10-CM | POA: Diagnosis not present

## 2017-12-13 DIAGNOSIS — Z6823 Body mass index (BMI) 23.0-23.9, adult: Secondary | ICD-10-CM | POA: Diagnosis not present

## 2017-12-13 DIAGNOSIS — N958 Other specified menopausal and perimenopausal disorders: Secondary | ICD-10-CM | POA: Diagnosis not present

## 2017-12-19 DIAGNOSIS — D2371 Other benign neoplasm of skin of right lower limb, including hip: Secondary | ICD-10-CM | POA: Diagnosis not present

## 2017-12-19 DIAGNOSIS — M795 Residual foreign body in soft tissue: Secondary | ICD-10-CM | POA: Diagnosis not present

## 2018-01-09 DIAGNOSIS — M542 Cervicalgia: Secondary | ICD-10-CM | POA: Diagnosis not present

## 2018-01-09 DIAGNOSIS — M25512 Pain in left shoulder: Secondary | ICD-10-CM | POA: Diagnosis not present

## 2018-01-09 DIAGNOSIS — M25511 Pain in right shoulder: Secondary | ICD-10-CM | POA: Diagnosis not present

## 2018-01-23 DIAGNOSIS — M542 Cervicalgia: Secondary | ICD-10-CM | POA: Diagnosis not present

## 2018-01-30 ENCOUNTER — Encounter (INDEPENDENT_AMBULATORY_CARE_PROVIDER_SITE_OTHER): Payer: PPO | Admitting: Ophthalmology

## 2018-01-30 DIAGNOSIS — H33302 Unspecified retinal break, left eye: Secondary | ICD-10-CM | POA: Diagnosis not present

## 2018-01-30 DIAGNOSIS — H338 Other retinal detachments: Secondary | ICD-10-CM

## 2018-01-30 DIAGNOSIS — H353113 Nonexudative age-related macular degeneration, right eye, advanced atrophic without subfoveal involvement: Secondary | ICD-10-CM

## 2018-01-30 DIAGNOSIS — H43813 Vitreous degeneration, bilateral: Secondary | ICD-10-CM

## 2018-02-09 DIAGNOSIS — M545 Low back pain: Secondary | ICD-10-CM | POA: Diagnosis not present

## 2018-05-15 DIAGNOSIS — N8182 Incompetence or weakening of pubocervical tissue: Secondary | ICD-10-CM | POA: Diagnosis not present

## 2018-05-15 DIAGNOSIS — N819 Female genital prolapse, unspecified: Secondary | ICD-10-CM | POA: Diagnosis not present

## 2018-06-25 ENCOUNTER — Telehealth: Payer: Self-pay | Admitting: Family Medicine

## 2018-06-25 NOTE — Telephone Encounter (Signed)
Copied from [place] [id number]. Topic: General - Other >> Jun 25, 2018  8:55 AM Keene Breath wrote: Reason for CRM: Patient called to request a refill for her Singulair and ventolin inhaler spray.  Patient stated that she had taken them off her med list, but it is allergy season and she is having those symptoms again.  Please advise and let patient know if she is going to have to come in for an appointment or if she can have the refills.  CB# [phone] or [phone]

## 2018-06-25 NOTE — Telephone Encounter (Signed)
Dr. Fry please advise. Thanks  

## 2018-06-26 NOTE — Telephone Encounter (Signed)
Call in Singulair 10 mg daily, #90 with 3 rf as well as Ventolin HFA to use 2 puffs every 4 hours prn, #1 with 5 rf

## 2018-06-27 MED ORDER — ALBUTEROL SULFATE HFA 108 (90 BASE) MCG/ACT IN AERS: 2.0000 | INHALATION_SPRAY | RESPIRATORY_TRACT | 5 refills | Status: DC | PRN

## 2018-06-27 MED ORDER — MONTELUKAST SODIUM 10 MG PO TABS: 10.0000 mg | ORAL_TABLET | Freq: Every day | ORAL | 3 refills | Status: DC

## 2018-06-27 NOTE — Telephone Encounter (Signed)
Refills sent to the pharmacy with refills per Dr. Sarajane Jews.

## 2018-07-05 ENCOUNTER — Ambulatory Visit (HOSPITAL_BASED_OUTPATIENT_CLINIC_OR_DEPARTMENT_OTHER): Admit: 2018-07-05 | Payer: PPO | Admitting: Obstetrics and Gynecology

## 2018-07-05 ENCOUNTER — Encounter (HOSPITAL_BASED_OUTPATIENT_CLINIC_OR_DEPARTMENT_OTHER): Payer: Self-pay

## 2018-07-05 SURGERY — ANTERIOR AND POSTERIOR REPAIR WITH SACROSPINOUS FIXATION
Anesthesia: Choice

## 2018-12-05 ENCOUNTER — Encounter: Payer: Self-pay | Admitting: Family Medicine

## 2018-12-05 ENCOUNTER — Other Ambulatory Visit: Payer: Self-pay

## 2018-12-05 ENCOUNTER — Ambulatory Visit (INDEPENDENT_AMBULATORY_CARE_PROVIDER_SITE_OTHER): Payer: PPO | Admitting: Family Medicine

## 2018-12-05 VITALS — BP 120/60 | HR 88 | Temp 98.1°F | Wt 118.0 lb

## 2018-12-05 DIAGNOSIS — E538 Deficiency of other specified B group vitamins: Secondary | ICD-10-CM | POA: Diagnosis not present

## 2018-12-05 DIAGNOSIS — Z0000 Encounter for general adult medical examination without abnormal findings: Secondary | ICD-10-CM

## 2018-12-05 LAB — HEPATIC FUNCTION PANEL
ALT: 14 U/L (ref 0–35)
AST: 19 U/L (ref 0–37)
Albumin: 4.5 g/dL (ref 3.5–5.2)
Alkaline Phosphatase: 55 U/L (ref 39–117)
Bilirubin, Direct: 0.1 mg/dL (ref 0.0–0.3)
Total Bilirubin: 0.4 mg/dL (ref 0.2–1.2)
Total Protein: 6.9 g/dL (ref 6.0–8.3)

## 2018-12-05 LAB — CBC WITH DIFFERENTIAL/PLATELET
Basophils Absolute: 0 10*3/uL (ref 0.0–0.1)
Basophils Relative: 0.6 % (ref 0.0–3.0)
Eosinophils Absolute: 0.1 10*3/uL (ref 0.0–0.7)
Eosinophils Relative: 1.7 % (ref 0.0–5.0)
HCT: 40 % (ref 36.0–46.0)
Hemoglobin: 13.6 g/dL (ref 12.0–15.0)
Lymphocytes Relative: 17.9 % (ref 12.0–46.0)
Lymphs Abs: 1.1 10*3/uL (ref 0.7–4.0)
MCHC: 34.1 g/dL (ref 30.0–36.0)
MCV: 95.7 fl (ref 78.0–100.0)
Monocytes Absolute: 0.7 10*3/uL (ref 0.1–1.0)
Monocytes Relative: 10.5 % (ref 3.0–12.0)
Neutro Abs: 4.4 10*3/uL (ref 1.4–7.7)
Neutrophils Relative %: 69.3 % (ref 43.0–77.0)
Platelets: 384 10*3/uL (ref 150.0–400.0)
RBC: 4.18 Mil/uL (ref 3.87–5.11)
RDW: 13.2 % (ref 11.5–15.5)
WBC: 6.4 10*3/uL (ref 4.0–10.5)

## 2018-12-05 LAB — BASIC METABOLIC PANEL
BUN: 21 mg/dL (ref 6–23)
CO2: 30 mEq/L (ref 19–32)
Calcium: 9.8 mg/dL (ref 8.4–10.5)
Chloride: 102 mEq/L (ref 96–112)
Creatinine, Ser: 0.81 mg/dL (ref 0.40–1.20)
GFR: 67.7 mL/min (ref >60.00)
Glucose, Bld: 98 mg/dL (ref 70–99)
Potassium: 4.5 mEq/L (ref 3.5–5.1)
Sodium: 140 mEq/L (ref 135–145)

## 2018-12-05 LAB — POC URINALSYSI DIPSTICK (AUTOMATED)
Bilirubin, UA: NEGATIVE
Glucose, UA: NEGATIVE
Ketones, UA: NEGATIVE
Leukocytes, UA: NEGATIVE
Nitrite, UA: NEGATIVE
Protein, UA: NEGATIVE
Spec Grav, UA: 1.015 (ref 1.010–1.025)
Urobilinogen, UA: 0.2 E.U./dL
pH, UA: 6 (ref 5.0–8.0)

## 2018-12-05 LAB — LIPID PANEL
Cholesterol: 283 mg/dL — ABNORMAL HIGH (ref 0–200)
HDL: 74 mg/dL (ref >39.00)
LDL Cholesterol: 188 mg/dL — ABNORMAL HIGH (ref 0–99)
NonHDL: 208.73
Total CHOL/HDL Ratio: 4
Triglycerides: 102 mg/dL (ref 0.0–149.0)
VLDL: 20.4 mg/dL (ref 0.0–40.0)

## 2018-12-05 LAB — TSH: TSH: 1.73 u[IU]/mL (ref 0.35–4.50)

## 2018-12-05 LAB — VITAMIN B12: Vitamin B-12: 825 pg/mL (ref 211–911)

## 2018-12-05 NOTE — Progress Notes (Signed)
   Subjective:    Patient ID: Alexandra Baldwin, female    DOB: 1934-08-25, 84 y.o.   MRN: [id number]  HPI Here for a well exam. She feels well. 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 They are considering the possibility of a cochlear implant. She still works full time.    Review of Systems  Constitutional: Negative.   HENT: Positive for hearing loss.   Eyes: Negative.   Respiratory: Negative.   Cardiovascular: Negative.   Gastrointestinal: Negative.   Genitourinary: Negative for decreased urine volume, difficulty urinating, dyspareunia, dysuria, enuresis, flank pain, frequency, hematuria, pelvic pain and urgency.  Musculoskeletal: Negative.   Skin: Negative.   Neurological: Negative.   Psychiatric/Behavioral: Negative.        Objective:   Physical Exam Constitutional:      General: She is not in acute distress.    Appearance: She is well-developed.  HENT:     Head: Normocephalic and atraumatic.     Right Ear: External ear normal.     Left Ear: External ear normal.     Nose: Nose normal.     Mouth/Throat:     Pharynx: No oropharyngeal exudate.  Eyes:     General: No scleral icterus.    Conjunctiva/sclera: Conjunctivae normal.     Pupils: Pupils are equal, round, and reactive to light.  Neck:     Musculoskeletal: Normal range of motion and neck supple.     Thyroid: No thyromegaly.     Vascular: No JVD.  Cardiovascular:     Rate and Rhythm: Normal rate and regular rhythm.     Heart sounds: Normal heart sounds. No murmur. No friction rub. No gallop.   Pulmonary:     Effort: Pulmonary effort is normal. No respiratory distress.     Breath sounds: Normal breath sounds. No wheezing or rales.  Chest:     Chest wall: No tenderness.  Abdominal:     General: Bowel sounds are normal. There is no distension.     Palpations: Abdomen is soft. There is no mass.     Tenderness: There is no abdominal tenderness. There is no guarding or rebound.   Musculoskeletal: Normal range of motion.        General: No tenderness.  Lymphadenopathy:     Cervical: No cervical adenopathy.  Skin:    General: Skin is warm and dry.     Findings: No erythema or rash.  Neurological:     Mental Status: She is alert and oriented to person, place, and time.     Cranial Nerves: No cranial nerve deficit.     Motor: No abnormal muscle tone.     Coordination: Coordination normal.     Deep Tendon Reflexes: Reflexes are normal and symmetric. Reflexes normal.  Psychiatric:        Behavior: Behavior normal.        Thought Content: Thought content normal.        Judgment: Judgment normal.           Assessment & Plan:  Well exam. We discussed diet and exercise. Get fasting labs.  [name], MD

## 2019-01-14 ENCOUNTER — Telehealth: Payer: Self-pay | Admitting: Family Medicine

## 2019-01-14 NOTE — Telephone Encounter (Signed)
Patient called and would like a copy of her 12/05/18 labs mail to her [place], [employer] [place]

## 2019-01-31 NOTE — Telephone Encounter (Signed)
Copied from [place] [id number]. Topic: General - Other >> Jan 31, 2019 10:03 AM Carolyn Stare wrote: Pt call to fup on why she has not received  her labs report that she ask to be mail to home

## 2019-01-31 NOTE — Telephone Encounter (Signed)
Sent to HIM.

## 2019-02-04 ENCOUNTER — Encounter (INDEPENDENT_AMBULATORY_CARE_PROVIDER_SITE_OTHER): Payer: Medicare HMO | Admitting: Ophthalmology

## 2019-02-21 DIAGNOSIS — H903 Sensorineural hearing loss, bilateral: Secondary | ICD-10-CM | POA: Diagnosis not present

## 2019-02-28 ENCOUNTER — Encounter (INDEPENDENT_AMBULATORY_CARE_PROVIDER_SITE_OTHER): Payer: PPO | Admitting: Ophthalmology

## 2019-02-28 ENCOUNTER — Other Ambulatory Visit: Payer: Self-pay

## 2019-02-28 DIAGNOSIS — H43813 Vitreous degeneration, bilateral: Secondary | ICD-10-CM | POA: Diagnosis not present

## 2019-02-28 DIAGNOSIS — H33303 Unspecified retinal break, bilateral: Secondary | ICD-10-CM | POA: Diagnosis not present

## 2019-02-28 DIAGNOSIS — H353131 Nonexudative age-related macular degeneration, bilateral, early dry stage: Secondary | ICD-10-CM | POA: Diagnosis not present

## 2019-03-05 ENCOUNTER — Telehealth: Payer: Self-pay

## 2019-03-05 NOTE — Telephone Encounter (Signed)
Copied from [place] [id number]. Topic: Appointment Scheduling - Transfer of Care >> Mar 04, 2019  2:02 PM Richardo Priest, [place] wrote: Pt is requesting to transfer FROM: Dr.Fry/ [place] Pt is requesting to transfer TO: Dr.Cirigliano  Reason for requested transfer: pt's would prefer female/ family sees Dr.C  Send CRM to patient's current PCP (transferring FROM).

## 2019-03-06 NOTE — Telephone Encounter (Signed)
That is fine with me.

## 2019-03-11 NOTE — Telephone Encounter (Signed)
Ok to tranfers from [place] to you?   Please advise

## 2019-03-13 NOTE — Telephone Encounter (Signed)
Ok with me 

## 2019-03-15 NOTE — Telephone Encounter (Signed)
Noted  

## 2019-03-18 NOTE — Telephone Encounter (Signed)
Patient notified of update  and verbalized understanding. 

## 2019-03-19 NOTE — Telephone Encounter (Signed)
Pt notified of update.  

## 2019-03-19 NOTE — Telephone Encounter (Signed)
Labs mailed

## 2019-03-19 NOTE — Telephone Encounter (Signed)
Copied from [place] [phone]. Topic: General - Other >> Mar 15, 2019  1:21 PM Alexandra Baldwin, [place] C wrote: Reason for CRM: pt called in to check the status of her (August visit) lab results being mailed to her. Pt would like to have them mailed to the address on file.   Please advise Virgina Jock pt directly.

## 2019-03-22 DIAGNOSIS — H903 Sensorineural hearing loss, bilateral: Secondary | ICD-10-CM | POA: Diagnosis not present

## 2019-03-28 NOTE — Telephone Encounter (Signed)
Called and left vm for patient to schedule TOC with Dr. Loletha Grayer

## 2019-03-29 NOTE — Telephone Encounter (Signed)
toc has been sch for 05-02-2019

## 2019-05-01 ENCOUNTER — Other Ambulatory Visit: Payer: Self-pay

## 2019-05-02 ENCOUNTER — Encounter: Payer: PPO | Admitting: Family Medicine

## 2019-06-05 ENCOUNTER — Encounter: Payer: PPO | Admitting: Family Medicine

## 2019-06-17 ENCOUNTER — Other Ambulatory Visit: Payer: Self-pay

## 2019-06-18 ENCOUNTER — Encounter: Payer: Self-pay | Admitting: Family Medicine

## 2019-06-18 ENCOUNTER — Ambulatory Visit (INDEPENDENT_AMBULATORY_CARE_PROVIDER_SITE_OTHER): Payer: PPO | Admitting: Family Medicine

## 2019-06-18 ENCOUNTER — Ambulatory Visit (INDEPENDENT_AMBULATORY_CARE_PROVIDER_SITE_OTHER): Payer: PPO

## 2019-06-18 VITALS — BP 120/60 | HR 90 | Temp 97.9°F | Wt 126.2 lb

## 2019-06-18 DIAGNOSIS — E538 Deficiency of other specified B group vitamins: Secondary | ICD-10-CM | POA: Diagnosis not present

## 2019-06-18 DIAGNOSIS — E782 Mixed hyperlipidemia: Secondary | ICD-10-CM | POA: Diagnosis not present

## 2019-06-18 DIAGNOSIS — R0781 Pleurodynia: Secondary | ICD-10-CM

## 2019-06-18 NOTE — Progress Notes (Signed)
   Subjective:    Patient ID: Alexandra Baldwin, female    DOB: 10-10-1934, 85 y.o.   MRN: [id number]  HPI Here for one month of intermittent sharp pain in the right lower anterior rib area. No recent trauma. No SOB, but it does hurt to take a deep breath at times. No cough or fever. No abdominal pain. She had one day of nausea a few weeks ago, but none since. No change in bowel or urinary habits.    Review of Systems  Constitutional: Negative.   Respiratory: Negative.   Cardiovascular: Positive for chest pain. Negative for palpitations and leg swelling.  Gastrointestinal: Negative.   Genitourinary: Negative.        Objective:   Physical Exam Constitutional:      Appearance: Normal appearance.  Cardiovascular:     Rate and Rhythm: Normal rate and regular rhythm.     Pulses: Normal pulses.     Heart sounds: Normal heart sounds.  Pulmonary:     Effort: Pulmonary effort is normal. No respiratory distress.     Breath sounds: Normal breath sounds. No stridor. No wheezing, rhonchi or rales.     Comments: She is tender over the right lower anterior ribs. No crepitus  Abdominal:     General: Abdomen is flat. Bowel sounds are normal. There is no distension.     Palpations: Abdomen is soft. There is no mass.     Tenderness: There is no abdominal tenderness. There is no guarding or rebound.     Hernia: No hernia is present.  Neurological:     Mental Status: She is alert.           Assessment & Plan:  Right rib pain, musculoskeletal in nature. Use Ibuprofen as needed get rib Xrays today.  [name], MD

## 2019-06-20 ENCOUNTER — Other Ambulatory Visit: Payer: Self-pay

## 2019-06-21 ENCOUNTER — Other Ambulatory Visit (INDEPENDENT_AMBULATORY_CARE_PROVIDER_SITE_OTHER): Payer: PPO

## 2019-06-21 ENCOUNTER — Telehealth: Payer: Self-pay | Admitting: Family Medicine

## 2019-06-21 DIAGNOSIS — E782 Mixed hyperlipidemia: Secondary | ICD-10-CM

## 2019-06-21 DIAGNOSIS — E538 Deficiency of other specified B group vitamins: Secondary | ICD-10-CM

## 2019-06-21 LAB — BASIC METABOLIC PANEL
BUN: 17 mg/dL (ref 6–23)
CO2: 30 mEq/L (ref 19–32)
Calcium: 9.3 mg/dL (ref 8.4–10.5)
Chloride: 102 mEq/L (ref 96–112)
Creatinine, Ser: 0.77 mg/dL (ref 0.40–1.20)
GFR: 71.68 mL/min (ref >60.00)
Glucose, Bld: 84 mg/dL (ref 70–99)
Potassium: 4.5 mEq/L (ref 3.5–5.1)
Sodium: 139 mEq/L (ref 135–145)

## 2019-06-21 LAB — HEPATIC FUNCTION PANEL
ALT: 16 U/L (ref 0–35)
AST: 19 U/L (ref 0–37)
Albumin: 3.9 g/dL (ref 3.5–5.2)
Alkaline Phosphatase: 60 U/L (ref 39–117)
Bilirubin, Direct: 0.1 mg/dL (ref 0.0–0.3)
Total Bilirubin: 0.4 mg/dL (ref 0.2–1.2)
Total Protein: 6.6 g/dL (ref 6.0–8.3)

## 2019-06-21 LAB — CBC WITH DIFFERENTIAL/PLATELET
Basophils Absolute: 0 10*3/uL (ref 0.0–0.1)
Basophils Relative: 0.6 % (ref 0.0–3.0)
Eosinophils Absolute: 0.2 10*3/uL (ref 0.0–0.7)
Eosinophils Relative: 2.7 % (ref 0.0–5.0)
HCT: 38.7 % (ref 36.0–46.0)
Hemoglobin: 13 g/dL (ref 12.0–15.0)
Lymphocytes Relative: 24 % (ref 12.0–46.0)
Lymphs Abs: 1.4 10*3/uL (ref 0.7–4.0)
MCHC: 33.5 g/dL (ref 30.0–36.0)
MCV: 96 fl (ref 78.0–100.0)
Monocytes Absolute: 0.7 10*3/uL (ref 0.1–1.0)
Monocytes Relative: 11.4 % (ref 3.0–12.0)
Neutro Abs: 3.7 10*3/uL (ref 1.4–7.7)
Neutrophils Relative %: 61.3 % (ref 43.0–77.0)
Platelets: 335 10*3/uL (ref 150.0–400.0)
RBC: 4.03 Mil/uL (ref 3.87–5.11)
RDW: 12.9 % (ref 11.5–15.5)
WBC: 6 10*3/uL (ref 4.0–10.5)

## 2019-06-21 LAB — LIPID PANEL
Cholesterol: 263 mg/dL — ABNORMAL HIGH (ref 0–200)
HDL: 65.1 mg/dL (ref >39.00)
LDL Cholesterol: 174 mg/dL — ABNORMAL HIGH (ref 0–99)
NonHDL: 198.04
Total CHOL/HDL Ratio: 4
Triglycerides: 119 mg/dL (ref 0.0–149.0)
VLDL: 23.8 mg/dL (ref 0.0–40.0)

## 2019-06-21 LAB — TSH: TSH: 2.81 u[IU]/mL (ref 0.35–4.50)

## 2019-06-21 LAB — VITAMIN B12: Vitamin B-12: 627 pg/mL (ref 211–911)

## 2019-06-21 NOTE — Telephone Encounter (Signed)
Pt returning call and would like a call back.

## 2019-06-21 NOTE — Telephone Encounter (Signed)
Returned call to patient. Patient picking up results. Will call the number when she get here.

## 2019-06-21 NOTE — Telephone Encounter (Signed)
Left detailed message that results were ready for pick up at the front desk.

## 2019-06-21 NOTE — Telephone Encounter (Signed)
Pt stated she would like a copy of her lab results from today whenever it is ready. She would like to pick it up from the office.  Pt can be reached at [phone]   Informed pt that results may not be ready today she understood

## 2019-07-10 ENCOUNTER — Encounter: Payer: PPO | Admitting: Family Medicine

## 2019-08-14 DIAGNOSIS — Z01419 Encounter for gynecological examination (general) (routine) without abnormal findings: Secondary | ICD-10-CM | POA: Diagnosis not present

## 2019-08-14 DIAGNOSIS — Z6824 Body mass index (BMI) 24.0-24.9, adult: Secondary | ICD-10-CM | POA: Diagnosis not present

## 2019-11-29 ENCOUNTER — Encounter: Payer: Self-pay | Admitting: Family Medicine

## 2019-11-29 ENCOUNTER — Other Ambulatory Visit: Payer: Self-pay

## 2019-11-29 ENCOUNTER — Ambulatory Visit (INDEPENDENT_AMBULATORY_CARE_PROVIDER_SITE_OTHER): Payer: PPO | Admitting: Family Medicine

## 2019-11-29 VITALS — BP 124/62 | HR 88 | Temp 98.3°F | Wt 128.0 lb

## 2019-11-29 DIAGNOSIS — R209 Unspecified disturbances of skin sensation: Secondary | ICD-10-CM

## 2019-11-29 DIAGNOSIS — M8949 Other hypertrophic osteoarthropathy, multiple sites: Secondary | ICD-10-CM

## 2019-11-29 DIAGNOSIS — E782 Mixed hyperlipidemia: Secondary | ICD-10-CM | POA: Diagnosis not present

## 2019-11-29 DIAGNOSIS — M159 Polyosteoarthritis, unspecified: Secondary | ICD-10-CM | POA: Insufficient documentation

## 2019-11-29 DIAGNOSIS — M150 Primary generalized (osteo)arthritis: Secondary | ICD-10-CM

## 2019-11-29 MED ORDER — GABAPENTIN 100 MG PO CAPS: 100.0000 mg | ORAL_CAPSULE | Freq: Three times a day (TID) | ORAL | 3 refills | Status: DC

## 2019-11-29 MED ORDER — GABAPENTIN 100 MG PO CAPS: 100.0000 mg | ORAL_CAPSULE | Freq: Every day | ORAL | 3 refills | Status: AC

## 2019-11-29 NOTE — Progress Notes (Signed)
   Subjective:    Patient ID: Alexandra Baldwin, female    DOB: 09/12/1934, 85 y.o.   MRN: [id number]  HPI Here for stiffness and pain in both hands and all the fingers. No wrist pain. She has tried BioFreeze with no relief. She gets some relief with Ibuprofen, but she wants to avoid taking NSAIDs if possible. She is now taking a collagen supplement and she thinks this helps.    Review of Systems  Constitutional: Negative.   Respiratory: Negative.   Cardiovascular: Negative.   Musculoskeletal: Positive for arthralgias.       Objective:   Physical Exam Constitutional:      Appearance: Normal appearance.  Cardiovascular:     Rate and Rhythm: Normal rate and regular rhythm.     Pulses: Normal pulses.     Heart sounds: Normal heart sounds.  Pulmonary:     Effort: Pulmonary effort is normal.     Breath sounds: Normal breath sounds.  Musculoskeletal:     Comments: The DIP's and PIP's on both hands are tender and swollen.   Neurological:     Mental Status: She is alert.           Assessment & Plan:  Osteoarthritis, she will try Gabapentin 100 mg at bedtime. She will also try Voltaren cream OTC.  [name], MD

## 2019-12-03 ENCOUNTER — Ambulatory Visit: Payer: PPO | Admitting: Family Medicine

## 2019-12-26 ENCOUNTER — Other Ambulatory Visit: Payer: Self-pay

## 2019-12-27 ENCOUNTER — Encounter: Payer: Self-pay | Admitting: Family Medicine

## 2019-12-27 ENCOUNTER — Ambulatory Visit (INDEPENDENT_AMBULATORY_CARE_PROVIDER_SITE_OTHER): Payer: PPO | Admitting: Family Medicine

## 2019-12-27 VITALS — BP 124/70 | HR 73 | Temp 96.9°F | Ht 60.0 in | Wt 126.4 lb

## 2019-12-27 DIAGNOSIS — Z23 Encounter for immunization: Secondary | ICD-10-CM

## 2019-12-27 DIAGNOSIS — E782 Mixed hyperlipidemia: Secondary | ICD-10-CM | POA: Diagnosis not present

## 2019-12-27 LAB — LIPID PANEL
Cholesterol: 214 mg/dL — ABNORMAL HIGH (ref 0–200)
HDL: 76.9 mg/dL (ref >39.00)
LDL Cholesterol: 116 mg/dL — ABNORMAL HIGH (ref 0–99)
NonHDL: 136.81
Total CHOL/HDL Ratio: 3
Triglycerides: 104 mg/dL (ref 0.0–149.0)
VLDL: 20.8 mg/dL (ref 0.0–40.0)

## 2019-12-27 NOTE — Progress Notes (Signed)
Alexandra Baldwin is a 85 y.o. female  Chief Complaint  Patient presents with  . Establish Care    TOC, needs cholesterol check, fasting this am, discuss Radon in her house    HPI: Alexandra Baldwin is a 85 y.o. female who is seen today for [facility], previous PCP Dr. Sarajane Jews, and for f/u on hyperlipidemia. 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  Pt is taking OTC supplements for a few years to help w/ cholesterol. Last FLP in 06/2019. She walks 100,000 steps per week.  Lab Results  Component Value Date   CHOL 263 (H) 06/21/2019   HDL 65.10 06/21/2019   LDLCALC 174 (H) 06/21/2019   TRIG 119.0 06/21/2019   CHOLHDL 4 06/21/2019     Past Medical History:  Diagnosis Date  . Allergy   . Gynecological examination    sees Dr. Radene Knee   . Hematuria, microscopic    benign, per Dr. Karsten Ro 2009  . Hyperlipidemia   . Insomnia   . Multiple thyroid nodules 2004   negative by biopsy, [place], and uptake scan  . Osteopenia    per DEXA 08-2008    Past Surgical History:  Procedure Laterality Date  . CATARACT EXTRACTION     bilateral per Dr. Dolores Lory   . COLONOSCOPY  07-2008   per Dr. Danise Mina, repeat in 5 yrs  . HERNIA REPAIR  [id number]   umbilical  . RETINAL DETACHMENT SURGERY     bilateral with laser    Social History   Socioeconomic History  . Marital status: Widowed    Spouse name: Not on file  . Number of children: Not on file  . Years of education: Not on file  . Highest education level: Not on file  Occupational History  . Not on file  Tobacco Use  . Smoking status: Never Smoker  . Smokeless tobacco: Never Used  Substance and Sexual Activity  . Alcohol use: No    Alcohol/week: 0.0 standard drinks  . Drug use: No  . Sexual activity: Not on file  Other Topics Concern  . Not on file  Social History Narrative  . Not on file   Social Determinants of Health   Financial Resource Strain:   . Difficulty of Paying Living Expenses: Not on file  Food Insecurity:   . Worried About  Charity fundraiser in the Last Year: Not on file  . Ran Out of Food in the Last Year: Not on file  Transportation Needs:   . Lack of Transportation (Medical): Not on file  . Lack of Transportation (Non-Medical): Not on file  Physical Activity:   . Days of Exercise per Week: Not on file  . Minutes of Exercise per Session: Not on file  Stress:   . Feeling of Stress : Not on file  Social Connections:   . Frequency of Communication with Friends and Family: Not on file  . Frequency of Social Gatherings with Friends and Family: Not on file  . Attends Religious Services: Not on file  . Active Member of Clubs or Organizations: Not on file  . Attends Archivist Meetings: Not on file  . Marital Status: Not on file  Intimate Partner Violence:   . Fear of Current or Ex-Partner: Not on file  . Emotionally Abused: Not on file  . Physically Abused: Not on file  . Sexually Abused: Not on file    History reviewed. No pertinent family history.   Immunization History  Administered Date(s) Administered  . Influenza Split 02/07/2011, 12/26/2012  . Influenza Whole 03/13/2007  . Influenza,inj,Quad PF,6+ Mos 12/24/2013  . Influenza-Unspecified 12/25/2014, 12/06/2015, 01/09/2017  . Moderna SARS-COVID-2 Vaccination 04/19/2019, 05/23/2019  . Pneumococcal Conjugate-13 05/09/2013  . Pneumococcal Polysaccharide-23 01/09/2006  . Td 02/10/2004  . Tdap 05/09/2013  . Zoster 06/19/2007    Outpatient Encounter Medications as of 12/27/2019  Medication Sig  . co-enzyme Q-10 30 MG capsule Take 30 mg by mouth 3 (three) times daily.  Marland Kitchen gabapentin (NEURONTIN) 100 MG capsule Take 1 capsule (100 mg total) by mouth at bedtime.  . melatonin 3 MG TABS tablet Take 3 mg by mouth at bedtime.  . Multiple Vitamin (MULTIVITAMIN) tablet Take 1 tablet by mouth daily.  . Omega-3 Fatty Acids (OMEGA-3 1450 PO) Take by mouth.  . levocetirizine (XYZAL) 5 MG tablet Take by mouth.   No facility-administered encounter  medications on file as of 12/27/2019.     ROS: Pertinent positives and negatives noted in HPI. Remainder of ROS non-contributory   No Known Allergies  BP 124/70   Pulse 73   Temp (!) 96.9 F (36.1 C) (Temporal)   Ht 5' (1.524 m)   Wt 126 lb 6.4 oz (57.3 kg)   SpO2 96%   BMI 24.69 kg/m    BP Readings from Last 3 Encounters:  12/27/19 124/70  11/29/19 124/62  06/18/19 120/60   Pulse Readings from Last 3 Encounters:  12/27/19 73  11/29/19 88  06/18/19 90   Wt Readings from Last 3 Encounters:  12/27/19 126 lb 6.4 oz (57.3 kg)  11/29/19 128 lb (58.1 kg)  06/18/19 126 lb 3.2 oz (57.2 kg)     Physical Exam Constitutional:      General: She is not in acute distress.    Appearance: Normal appearance. She is not ill-appearing.  Cardiovascular:     Rate and Rhythm: Normal rate and regular rhythm.     Pulses: Normal pulses.  Pulmonary:     Effort: Pulmonary effort is normal. No respiratory distress.     Breath sounds: Normal breath sounds.  Musculoskeletal:     Right lower leg: No edema.     Left lower leg: No edema.  Neurological:     General: No focal deficit present.     Mental Status: She is alert and oriented to person, place, and time.  Psychiatric:        Mood and Affect: Mood normal.        Behavior: Behavior normal.      A/P:  1. Hyperlipemia, mixed - not on Rx med, takes 2 OTC supplements x years - pt walks 100,000 steps per week - Lipid panel - f/u in 6 mo Discussed plan and reviewed medications with patient, including risks, benefits, and potential side effects. Pt expressed understand. All questions answered.    This visit occurred during the SARS-CoV-2 public health emergency.  Safety protocols were in place, including screening questions prior to the visit, additional usage of staff PPE, and extensive cleaning of exam room while observing appropriate contact time as indicated for disinfecting solutions.

## 2019-12-27 NOTE — Addendum Note (Signed)
Addended by: Armandina Gemma L on: 12/27/2019 11:01 AM   Modules accepted: Orders

## 2020-01-20 ENCOUNTER — Telehealth: Payer: Self-pay | Admitting: Family Medicine

## 2020-01-20 NOTE — Telephone Encounter (Signed)
Patient declined did not want to do AWV, not necessary-do not call

## 2020-02-06 ENCOUNTER — Telehealth: Payer: Self-pay | Admitting: Family Medicine

## 2020-02-06 MED ORDER — TIZANIDINE HCL 4 MG PO CAPS: 4.0000 mg | ORAL_CAPSULE | Freq: Every evening | ORAL | 0 refills | Status: AC | PRN

## 2020-02-06 NOTE — Telephone Encounter (Signed)
Rx sent 

## 2020-02-06 NOTE — Telephone Encounter (Signed)
Caller Name: Yatzary Call back phone #: [phone]  Reason for Call: Pt called stating her and Dr. Loletha Grayer talked 9/17 about shoulder pain and tingling in hands. Pt went to orthopedic and then to a massage therapist. She said that they advised her that she has a lot of tightness and felt it was a pinched nerve. Pt is asking if a muscle relaxer might help?  [place]   [id number] [address], [place] Phone:  [phone]  Fax:  [phone]

## 2020-02-06 NOTE — Telephone Encounter (Signed)
Please review message and advise.   Thanks. Dm/cma  

## 2020-02-07 NOTE — Telephone Encounter (Signed)
lft VM that RX was sent ot the pharmacy and if any questions to please call back.  Dm/cma

## 2021-08-01 IMAGING — DX DG RIBS 2V*R*
4 series · 4 of 4 positions shown · non-contrast
Comparison: Chest radiograph 02/04/2015.

CLINICAL DATA: One month of pain in right ribs.

EXAM:
RIGHT RIBS - 2 VIEW

[hemithorax (ribs) ap (1 of 2)]
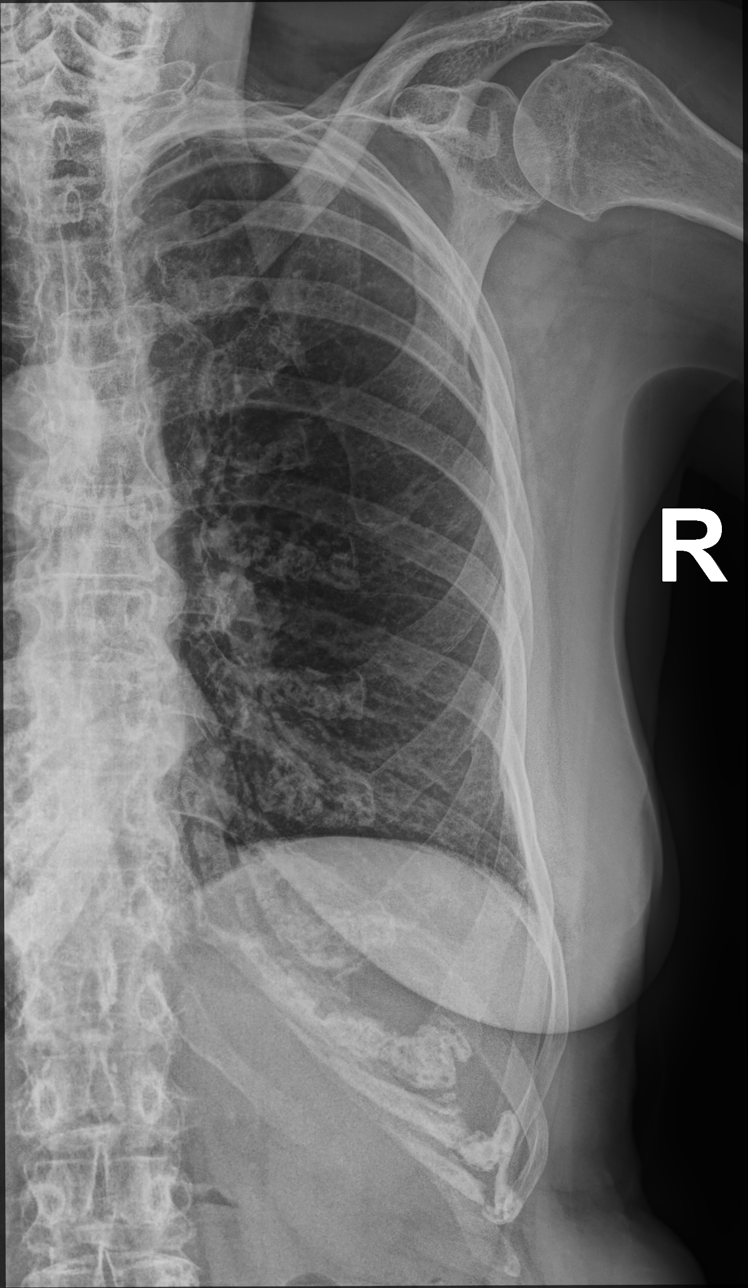

[hemithorax (ribs) mlo (1 of 2)]
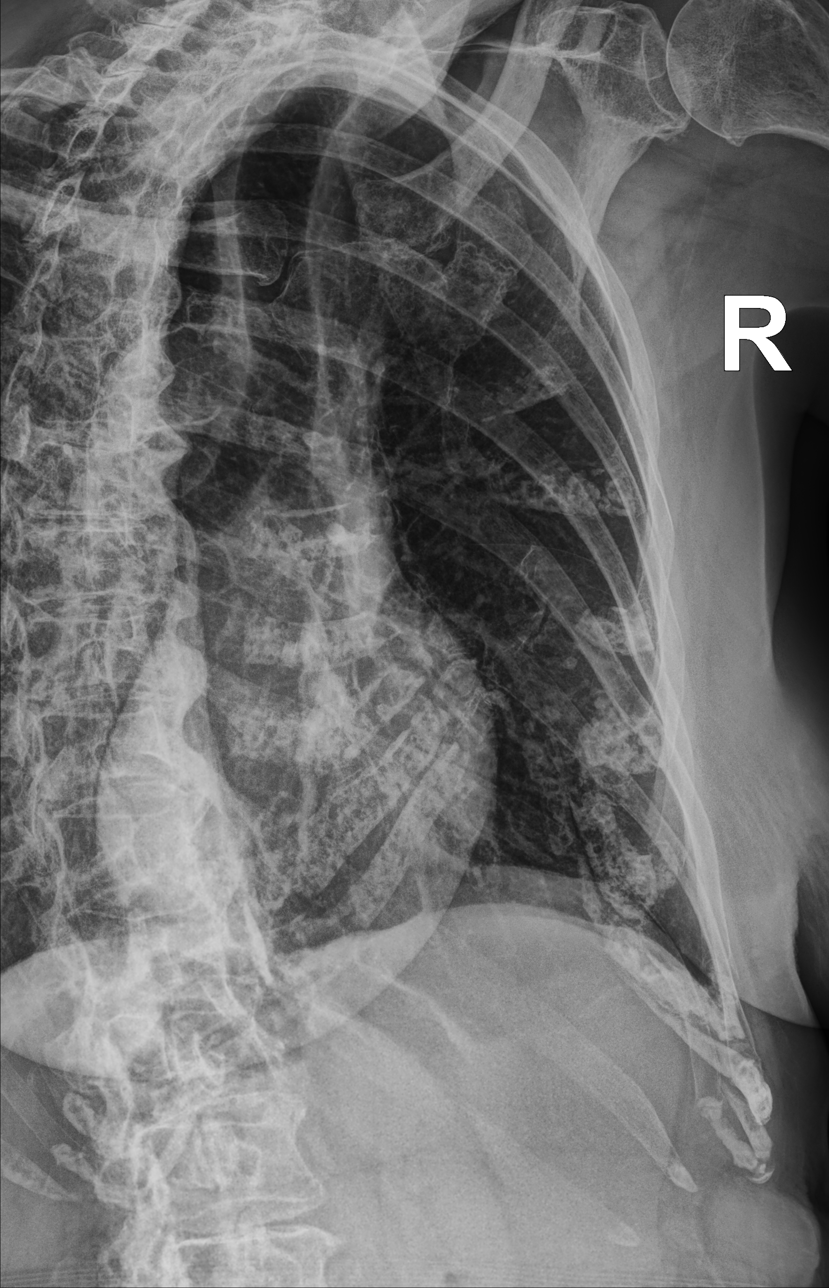

[hemithorax (ribs) ap (2 of 2)]
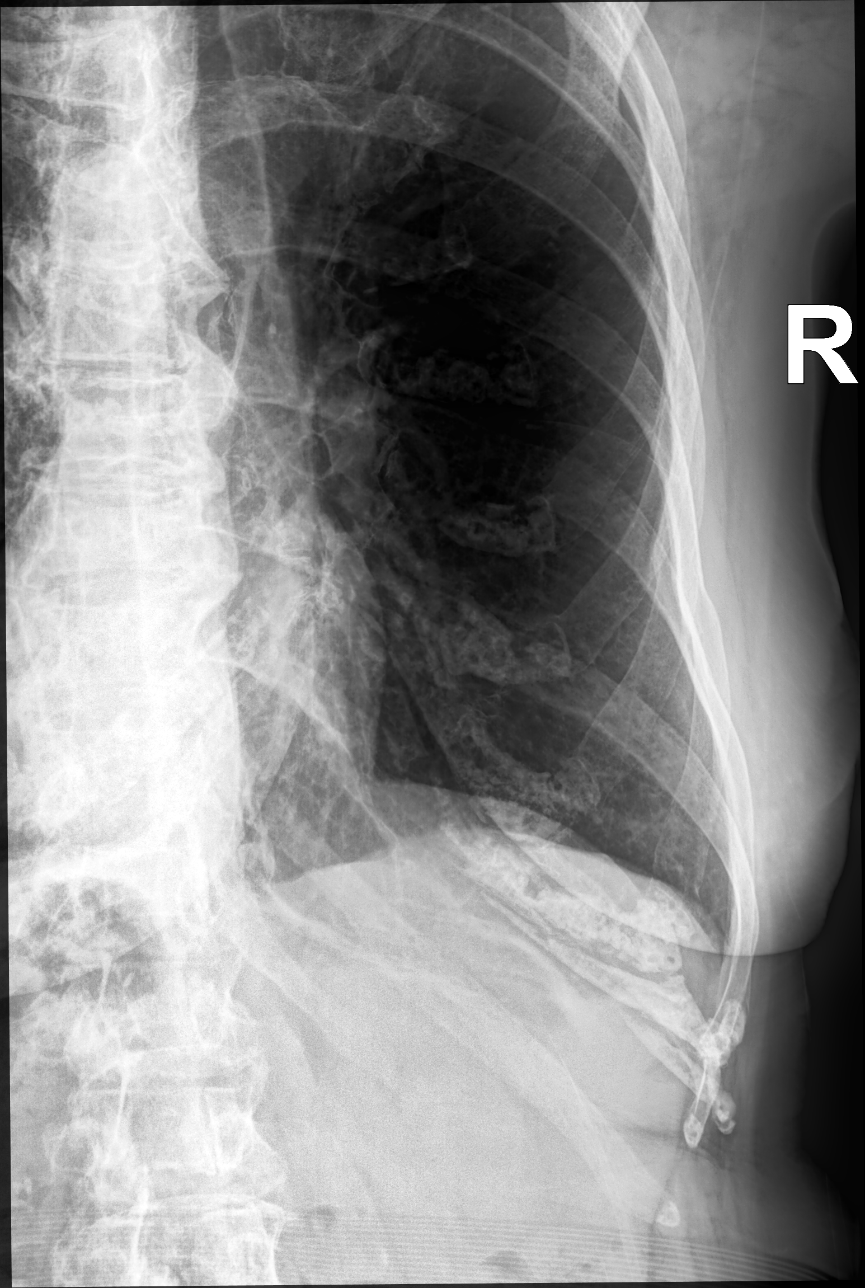

[hemithorax (ribs) mlo (2 of 2)]
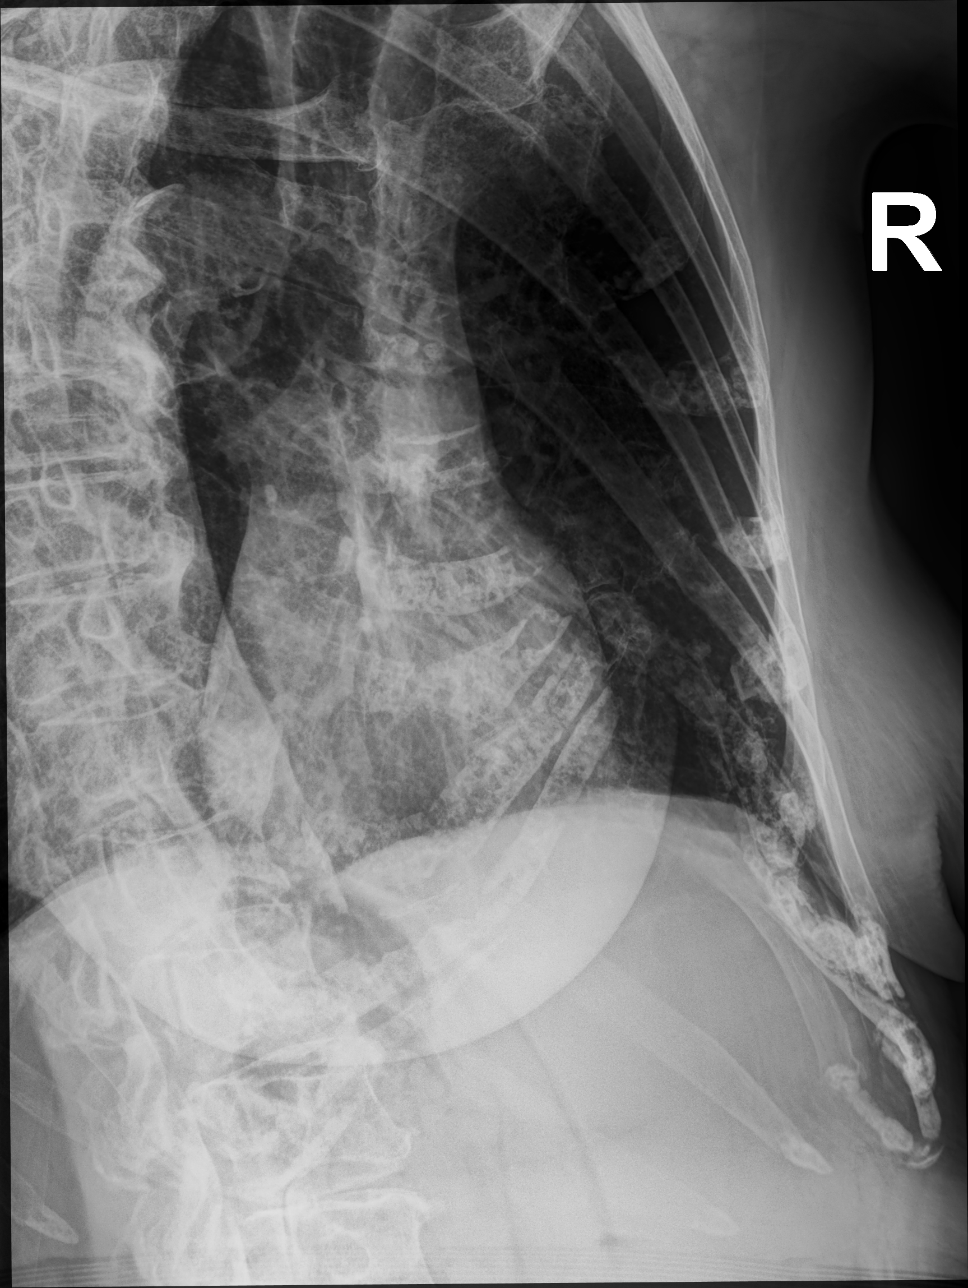

[4 of 4 positions shown; findings below may reference images not displayed]

FINDINGS: Old fractures of the left sixth and seventh anterolateral ribs. No
acute fracture. Visualized portion of the left chest is
unremarkable.
IMPRESSION: No acute findings.
